# Patient Record
Sex: Female | Born: 1980 | Race: White | Hispanic: No | Marital: Single | Smoking: Never smoker
Health system: Southern US, Community
[De-identification: ages and names within clinical notes are randomized; demographics above are authoritative.]

## PROBLEM LIST (undated history)

## (undated) HISTORY — PX: HERNIA REPAIR: SHX51

---

## 2004-08-13 ENCOUNTER — Emergency Department: Payer: Self-pay | Admitting: Emergency Medicine

## 2009-10-23 ENCOUNTER — Emergency Department (HOSPITAL_COMMUNITY): Admission: EM | Admit: 2009-10-23 | Discharge: 2009-10-23 | Payer: Self-pay | Admitting: Emergency Medicine

## 2011-03-04 ENCOUNTER — Emergency Department (HOSPITAL_COMMUNITY)
Admission: EM | Admit: 2011-03-04 | Discharge: 2011-03-04 | Disposition: A | Discharge status: Home / Self Care | Payer: Self-pay | Attending: Emergency Medicine | Admitting: Emergency Medicine

## 2011-03-04 ENCOUNTER — Emergency Department (HOSPITAL_COMMUNITY): Payer: Self-pay

## 2011-03-04 ENCOUNTER — Encounter: Payer: Self-pay | Admitting: Emergency Medicine

## 2011-03-04 DIAGNOSIS — S46911A Strain of unspecified muscle, fascia and tendon at shoulder and upper arm level, right arm, initial encounter: Secondary | ICD-10-CM

## 2011-03-04 DIAGNOSIS — M79609 Pain in unspecified limb: Secondary | ICD-10-CM | POA: Insufficient documentation

## 2011-03-04 DIAGNOSIS — M25519 Pain in unspecified shoulder: Secondary | ICD-10-CM | POA: Insufficient documentation

## 2011-03-04 DIAGNOSIS — X500XXA Overexertion from strenuous movement or load, initial encounter: Secondary | ICD-10-CM | POA: Insufficient documentation

## 2011-03-04 DIAGNOSIS — IMO0002 Reserved for concepts with insufficient information to code with codable children: Secondary | ICD-10-CM | POA: Insufficient documentation

## 2011-03-04 DIAGNOSIS — M542 Cervicalgia: Secondary | ICD-10-CM | POA: Insufficient documentation

## 2011-03-04 MED ORDER — OXYCODONE-ACETAMINOPHEN 5-325 MG PO TABS: 1.0000 | ORAL_TABLET | ORAL | Status: AC | PRN

## 2011-03-04 NOTE — ED Notes (Signed)
Pt c/o pain in her right shoulder x 1 week. Denies any injury. Pt states that it hurts to move her shoulder. Alert and oriented x 3. Skin warm and dry. Color pink.

## 2011-03-04 NOTE — ED Notes (Signed)
Pt c/o right shoulder pain, radiating down right arm x 1 week. Pain worse with movement/lifting. 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

## 2011-03-04 NOTE — ED Provider Notes (Signed)
History   This chart was scribed for Joya Gaskins, MD by Charolett Bumpers . The patient was seen in room APFT23/APFT23 and the patient's care was started at 4:20pm.   CSN: [id number]  Arrival date & time 03/04/11  1500   First MD Initiated Contact with Patient 03/04/11 1620      Chief Complaint  Patient presents with  . Shoulder Pain    HPI Rebekah Mejia is a 30 y.o. female who presents to the Emergency Department complaining of constant, moderate to severe right shoulder pain that radiates down her right arm with an onset of 1 week ago. She has associated neck pain with movement. She denies fever, chills, chest pain, SOB, and weakness in extremities. The shoulder pain is made worse with movement and laying on it. Patient also stated she has taken tylenol and applied heat to the area with no relief. She stated she has had similar pain when she had a shoulder sprain a few years ago. Patient also stated she had no major medical problems or surgeries on right shoulder/arm.    History reviewed. No pertinent past medical history.  History reviewed. No pertinent past surgical history.  No family history on file.  History  Substance Use Topics  . Smoking status: Never Smoker   . Smokeless tobacco: Not on file  . Alcohol Use: No    OB History    Grav Para Term Preterm Abortions TAB SAB Ect Mult Living                  Review of Systems A complete 10 system review of systems was obtained and is otherwise negative except as noted in the HPI and PMH.   Allergies  Review of patient's allergies indicates no known allergies.  Home Medications  No current outpatient prescriptions on file.  BP 135/92  Pulse 88  Temp(Src) 98.3 F (36.8 C) (Oral)  Resp 20  SpO2 99%  LMP 02/27/2011  Physical Exam CONSTITUTIONAL: Well developed/well nourished HEAD AND FACE: Normocephalic/atraumatic EYES: EOMI/PERRL ENMT: Mucous membranes moist NECK: supple no meningeal  signs SPINE:entire spine nontender CV: S1/S2 noted, no murmurs/rubs/gallops noted LUNGS: Lungs are clear to auscultation bilaterally, no apparent distress ABDOMEN: soft, nontender, no rebound or guarding NEURO: Pt is awake/alert, moves all extremitiesx4 EXTREMITIES: pulses normal, full ROM in extremities, right anterior shoulder tender, no weakness in extremities. Equal hand grip, equal power with elbow flexion.  No erythema/warmth noted to right shoulder SKIN: warm, color normal PSYCH: no abnormalities of mood noted  ED Course  Procedures   DIAGNOSTIC STUDIES: Oxygen Saturation is 99% on room air, normal by my interpretation.    COORDINATION OF CARE:   Labs Reviewed - No data to display Dg Shoulder Right  03/04/2011  *RADIOLOGY REPORT*  Clinical Data: While lifting at work  RIGHT SHOULDER - 2+ VIEW  Comparison: None.  Findings: No fracture or dislocation.  The glenohumeral and acromioclavicular joint spaces are preserved.  No evidence of calcific tendonitis. The regional soft tissues are normal.  Limited visualization of the adjacent thorax is normal.  IMPRESSION: Normal radiographs of the right shoulder.  Original Report Authenticated By: Waynard Reeds, M.D.       MDM  xrays reviewed and considered Nursing notes reviewed and considered in documentation    I personally performed the services described in this documentation, which was scribed in my presence. The recorded information has been reviewed and considered.         Dorinda Hill  Forestine Chute, MD 03/04/11 1655

## 2011-10-20 ENCOUNTER — Emergency Department (HOSPITAL_COMMUNITY)
Admission: EM | Admit: 2011-10-20 | Discharge: 2011-10-20 | Disposition: A | Discharge status: Home / Self Care | Payer: Self-pay | Attending: Emergency Medicine | Admitting: Emergency Medicine

## 2011-10-20 ENCOUNTER — Encounter (HOSPITAL_COMMUNITY): Payer: Self-pay | Admitting: *Deleted

## 2011-10-20 ENCOUNTER — Emergency Department (HOSPITAL_COMMUNITY): Payer: Self-pay

## 2011-10-20 DIAGNOSIS — X500XXA Overexertion from strenuous movement or load, initial encounter: Secondary | ICD-10-CM | POA: Insufficient documentation

## 2011-10-20 DIAGNOSIS — S46911A Strain of unspecified muscle, fascia and tendon at shoulder and upper arm level, right arm, initial encounter: Secondary | ICD-10-CM

## 2011-10-20 DIAGNOSIS — Y9269 Other specified industrial and construction area as the place of occurrence of the external cause: Secondary | ICD-10-CM | POA: Insufficient documentation

## 2011-10-20 DIAGNOSIS — IMO0002 Reserved for concepts with insufficient information to code with codable children: Secondary | ICD-10-CM | POA: Insufficient documentation

## 2011-10-20 MED ORDER — HYDROCODONE-ACETAMINOPHEN 5-325 MG PO TABS: 12.0000 | ORAL_TABLET | Freq: Four times a day (QID) | ORAL | Status: AC | PRN

## 2011-10-20 NOTE — ED Provider Notes (Signed)
History     CSN: [id number]  Arrival date & time 10/20/11  1734   First MD Initiated Contact with Patient 10/20/11 1922      Chief Complaint  Patient presents with  . 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  She has not notified her employer and has not worked since the injury.  Denies pre-existing shoulder problem.  "i really need something for pain".  Patient is a 31 y.o. female presenting with shoulder pain. The history is provided by the patient. No language interpreter was used.  Shoulder Pain This is a new problem. Episode onset: ~ 1 week ago. The problem occurs constantly. The problem has been unchanged. Pertinent negatives include no numbness or weakness. She has tried NSAIDs for the symptoms. The treatment provided no relief.    History reviewed. No pertinent past medical history.  History reviewed. No pertinent past surgical history.  History reviewed. No pertinent family history.  History  Substance Use Topics  . Smoking status: Never Smoker   . Smokeless tobacco: Not on file  . Alcohol Use: No    OB History    Grav Para Term Preterm Abortions TAB SAB Ect Mult Living                  Review of Systems  Musculoskeletal:       Shoulder injury.  Neurological: Negative for weakness and numbness.  All other systems reviewed and are negative.    Allergies  Percocet  Home Medications   Current Outpatient Rx  Name Route Sig Dispense Refill  . IBUPROFEN 200 MG PO TABS Oral Take 400 mg by mouth every 6 (six) hours as needed. For pain    . HYDROCODONE-ACETAMINOPHEN 5-325 MG PO TABS Oral Take 12 tablets by mouth every 6 (six) hours as needed for pain. 12 tablet 0    BP 150/83  Pulse 75  Temp 98.5 F (36.9 C) (Oral)  Resp 17  Ht 5\' 4"  (1.626 m)  Wt 156 lb 8 oz (70.988 kg)  BMI 26.86 kg/m2   SpO2 100%  LMP 10/11/2011  Physical Exam  Nursing note and vitals reviewed. Constitutional: She is oriented to person, place, and time. She appears well-developed and well-nourished. No distress.  HENT:  Head: Normocephalic and atraumatic.  Eyes: EOM are normal.  Neck: Normal range of motion.  Cardiovascular: Normal rate, regular rhythm and normal heart sounds.   Pulmonary/Chest: Effort normal and breath sounds normal.  Abdominal: Soft. She exhibits no distension. There is no tenderness.  Musculoskeletal: She exhibits tenderness.       Right shoulder: She exhibits decreased range of motion, tenderness, bony tenderness and pain. She exhibits no swelling, no effusion, no crepitus, no deformity, no laceration and normal pulse.       Arms: Neurological: She is alert and oriented to person, place, and time.  Skin: Skin is warm and dry.  Psychiatric: She has a normal mood and affect. Judgment normal.    ED Course  Procedures (including critical care time)  Labs Reviewed - No data to display Dg Shoulder Right  10/20/2011  *RADIOLOGY REPORT*  Clinical Data: Shoulder pain for 1 week.  No injury.  RIGHT SHOULDER - 2+ VIEW  Comparison:  None.  Findings:  There is no evidence of fracture or dislocation.  There is no evidence of arthropathy or other focal bone abnormality. Soft tissues are  unremarkable.  IMPRESSION: Negative.  Original Report Authenticated By: Elsie Stain, M.D.     1. Muscle strain of right shoulder       MDM  rx-hydrocodone, 12 Sling, ice  Ibuprofen F/u with dr. Hilda Lias prn        Evalina Field, PA 10/20/11 [phone]

## 2011-10-20 NOTE — ED Provider Notes (Signed)
Medical screening examination/treatment/procedure(s) were performed by non-physician practitioner and as supervising physician I was immediately available for consultation/collaboration. Devoria Albe, MD, FACEP   Ward Givens, MD 10/20/11 [phone]

## 2011-10-20 NOTE — ED Notes (Signed)
Pt c/o left shoulder pain x1 week. Pt could not recall a specific event in which she injured the shoulder but she thinks she "pulled a muscle".

## 2011-10-20 NOTE — ED Notes (Signed)
Rt shoulder pain , increased pain with movement. No injury

## 2013-12-31 ENCOUNTER — Emergency Department: Payer: Self-pay | Admitting: Emergency Medicine

## 2015-03-10 ENCOUNTER — Encounter (HOSPITAL_COMMUNITY): Payer: Self-pay | Admitting: Emergency Medicine

## 2015-03-10 ENCOUNTER — Emergency Department (HOSPITAL_COMMUNITY): Payer: Self-pay

## 2015-03-10 ENCOUNTER — Emergency Department (HOSPITAL_COMMUNITY)
Admission: EM | Admit: 2015-03-10 | Discharge: 2015-03-10 | Disposition: A | Discharge status: Home / Self Care | Payer: Self-pay | Attending: Emergency Medicine | Admitting: Emergency Medicine

## 2015-03-10 DIAGNOSIS — Y92410 Unspecified street and highway as the place of occurrence of the external cause: Secondary | ICD-10-CM | POA: Insufficient documentation

## 2015-03-10 DIAGNOSIS — Y998 Other external cause status: Secondary | ICD-10-CM | POA: Insufficient documentation

## 2015-03-10 DIAGNOSIS — Y9389 Activity, other specified: Secondary | ICD-10-CM | POA: Insufficient documentation

## 2015-03-10 DIAGNOSIS — S4991XA Unspecified injury of right shoulder and upper arm, initial encounter: Secondary | ICD-10-CM | POA: Insufficient documentation

## 2015-03-10 DIAGNOSIS — S4992XA Unspecified injury of left shoulder and upper arm, initial encounter: Secondary | ICD-10-CM | POA: Insufficient documentation

## 2015-03-10 MED ORDER — CYCLOBENZAPRINE HCL 5 MG PO TABS: 5.0000 mg | ORAL_TABLET | Freq: Three times a day (TID) | ORAL | Status: DC | PRN

## 2015-03-10 MED ORDER — NAPROXEN 500 MG PO TABS: 500.0000 mg | ORAL_TABLET | Freq: Two times a day (BID) | ORAL | Status: DC

## 2015-03-10 MED ORDER — IBUPROFEN 800 MG PO TABS
800.0000 mg | ORAL_TABLET | Freq: Once | ORAL | Status: AC
  Administered 2015-03-10: 800 mg via ORAL
  Filled 2015-03-10: qty 1

## 2015-03-10 MED ORDER — IBUPROFEN 600 MG PO TABS: 600.0000 mg | ORAL_TABLET | Freq: Four times a day (QID) | ORAL | Status: DC | PRN

## 2015-03-10 NOTE — ED Notes (Signed)
Called for triage and no response ?

## 2015-03-10 NOTE — ED Notes (Signed)
Patient stated that she is not happy with prescriptions given to her. I informed her that we were not prescribing narcotics she said she wanted to work something out with the provider. I reiterated that she would not write an Rx for percocet. She wants to talk with provider.
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
## 2016-05-06 ENCOUNTER — Encounter (HOSPITAL_COMMUNITY): Payer: Self-pay | Admitting: Emergency Medicine

## 2016-05-06 ENCOUNTER — Emergency Department (HOSPITAL_COMMUNITY)
Admission: EM | Admit: 2016-05-06 | Discharge: 2016-05-06 | Disposition: A | Discharge status: Home / Self Care | Payer: Self-pay | Attending: Emergency Medicine | Admitting: Emergency Medicine

## 2016-05-06 ENCOUNTER — Emergency Department (HOSPITAL_COMMUNITY): Payer: Self-pay

## 2016-05-06 DIAGNOSIS — R52 Pain, unspecified: Secondary | ICD-10-CM

## 2016-05-06 DIAGNOSIS — M542 Cervicalgia: Secondary | ICD-10-CM | POA: Insufficient documentation

## 2016-05-06 DIAGNOSIS — Z79899 Other long term (current) drug therapy: Secondary | ICD-10-CM | POA: Insufficient documentation

## 2016-05-06 DIAGNOSIS — M546 Pain in thoracic spine: Secondary | ICD-10-CM | POA: Insufficient documentation

## 2016-05-06 MED ORDER — METHOCARBAMOL 500 MG PO TABS: 500.0000 mg | ORAL_TABLET | Freq: Two times a day (BID) | ORAL | 0 refills | Status: DC | PRN

## 2016-05-06 MED ORDER — IBUPROFEN 600 MG PO TABS: 600.0000 mg | ORAL_TABLET | Freq: Four times a day (QID) | ORAL | 0 refills | Status: AC | PRN

## 2016-05-06 NOTE — Discharge Instructions (Signed)
Back Pain: ° ° °Your back pain should be treated with medicines such as ibuprofen or aleve and this back pain should get better over the next 2 weeks.  However if you develop severe or worsening pain, low back pain with fever, numbness, weakness or inability to walk or urinate, you should return to the ER immediately.  Please follow up with your doctor this week for a recheck if still having symptoms. °Low back pain is discomfort in the lower back that may be due to injuries to muscles and ligaments around the spine.  Occasionally, it may be caused by a a problem to a part of the spine called a disc.  The pain may last several days or a week;  However, most patients get completely well in 4 weeks. ° °Self - care:  The application of heat can help soothe the pain.  Maintaining your daily activities, including walking, is encourged, as it will help you get better faster than just staying in bed. ° °Medications are also useful to help with pain control.  A commonly prescribed medications includes acetaminophen.  This medication is generally safe, though you should not take more than 8 of the extra strength (500mg) pills a day. ° °Non steroidal anti inflammatory medications including Ibuprofen and naproxen;  These medications help both pain and swelling and are very useful in treating back pain.  They should be taken with food, as they can cause stomach upset, and more seriously, stomach bleeding.   ° °Muscle relaxants:  These medications can help with muscle tightness that is a cause of lower back pain.  Most of these medications can cause drowsiness, and it is not safe to drive or use dangerous machinery while taking them. ° °You will need to follow up with  Your primary healthcare provider in 1-2 weeks for reassessment. ° °Be aware that if you develop new symptoms, such as a fever, leg weakness, difficulty with or loss of control of your urine or bowels, abdominal pain, or more severe pain, you will need to seek  medical attention and  / or return to the Emergency department. ° °If you do not have a doctor see the list below. ° °[place] [facility] Doctor List ° ° ° °Edward Hawkins MD. Specialty: Pulmonary Disease Contact information: [address]  °[address]  °[place] [address]  °[phone]  ° °Margaret Simpson, MD. Specialty: Family Medicine Contact information: [address]  °[place] [address]  °[phone]  ° °Scott Luking, MD. Specialty: Family Medicine Contact information: [address]  °[address]  °[place] [address]  °[phone]  ° °Tesfaye Fanta, MD Specialty: Internal Medicine Contact information: [address]  °[place] [address]  °[phone]  ° °Zach Hall, MD. Specialty: Internal Medicine Contact information: [address]  °[place] [address]  °[phone]  ° °Angus Mcinnis, MD. Specialty: Family Medicine Contact information: [address]  °[place] [address]  °[phone]  ° °Stephen Knowlton, MD. Specialty: Family Medicine Contact information: [address]  °[address]  °[place] [address]  °[phone]  ° °Roy Fagan, MD. Specialty: Internal Medicine Contact information: [address]  °[address]  ° [place] [address]  °[phone]  ° ° ° °

## 2016-05-06 NOTE — ED Triage Notes (Signed)
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

## 2016-05-06 NOTE — ED Notes (Signed)
Pt returned from X-ray.  
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
## 2016-07-28 ENCOUNTER — Ambulatory Visit
Admission: EM | Admit: 2016-07-28 | Discharge: 2016-07-28 | Disposition: A | Discharge status: Home / Self Care | Payer: Self-pay | Attending: Internal Medicine | Admitting: Internal Medicine

## 2016-07-28 ENCOUNTER — Encounter: Payer: Self-pay | Admitting: *Deleted

## 2016-07-28 DIAGNOSIS — H66002 Acute suppurative otitis media without spontaneous rupture of ear drum, left ear: Secondary | ICD-10-CM

## 2016-07-28 MED ORDER — AMOXICILLIN 875 MG PO TABS: 875.0000 mg | ORAL_TABLET | Freq: Two times a day (BID) | ORAL | 0 refills | Status: AC

## 2016-07-28 NOTE — ED Provider Notes (Signed)
[facility]    CSN: [id number] Arrival date & time: 07/28/16  1317     History   Chief Complaint Chief Complaint  Patient presents with  . Otalgia    HPI Rebekah Mejia is a 36 y.o. female.   Pt c/o of ear pain and decreased hearing x1 week.  She admits to subjective fevers.       History reviewed. No pertinent past medical history.  There are no active problems to display for this patient.   Past Surgical History:  Procedure Laterality Date  . HERNIA REPAIR      OB History    Gravida Para Term Preterm AB Living   0 0 0 0 0 0   SAB TAB Ectopic Multiple Live Births   0 0 0 0 0       Home Medications    Prior to Admission medications   Medication Sig Start Date End Date Taking? Authorizing Provider  amoxicillin (AMOXIL) 875 MG tablet Take 1 tablet (875 mg total) by mouth 2 (two) times daily. 07/28/16 08/07/16  Arnaldo Nataliamond, Traven Davids S, MD  cyclobenzaprine (FLEXERIL) 5 MG tablet Take 1 tablet (5 mg total) by mouth 3 (three) times daily as needed for muscle spasms. 03/10/15   Burgess AmorIdol, Julie, PA-C  ibuprofen (ADVIL,MOTRIN) 600 MG tablet Take 1 tablet (600 mg total) by mouth every 6 (six) hours as needed. 05/06/16   Eber HongMiller, Brian, MD  methocarbamol (ROBAXIN) 500 MG tablet Take 1 tablet (500 mg total) by mouth 2 (two) times daily as needed for muscle spasms. 05/06/16   Eber HongMiller, Brian, MD  naproxen (NAPROSYN) 500 MG tablet Take 1 tablet (500 mg total) by mouth 2 (two) times daily. 03/10/15   Burgess AmorIdol, Julie, PA-C    Family History History reviewed. No pertinent family history.  Social History Social History  Substance Use Topics  . Smoking status: Never Smoker  . Smokeless tobacco: Never Used  . Alcohol use No     Allergies   Patient has no known allergies.   Review of Systems Review of Systems  Constitutional: Negative for chills and fever.  HENT: Positive for ear pain. Negative for sore throat and tinnitus.   Eyes: Negative for redness.  Respiratory:  Negative for cough and shortness of breath.   Cardiovascular: Negative for chest pain and palpitations.  Gastrointestinal: Negative for abdominal pain, diarrhea, nausea and vomiting.  Genitourinary: Negative for dysuria, frequency and urgency.  Musculoskeletal: Negative for myalgias.  Skin: Negative for rash.       No lesions  Neurological: Negative for weakness.  Hematological: Does not bruise/bleed easily.  Psychiatric/Behavioral: Negative for suicidal ideas.     Physical Exam Triage Vital Signs ED Triage Vitals  Enc Vitals Group     BP 07/28/16 1349 117/90     Pulse Rate 07/28/16 1349 94     Resp 07/28/16 1349 16     Temp 07/28/16 1349 98.5 F (36.9 C)     Temp Source 07/28/16 1349 Oral     SpO2 07/28/16 1349 98 %     Weight 07/28/16 1348 171 lb (77.6 kg)     Height 07/28/16 1348 5\' 4"  (1.626 m)     Head Circumference --      Peak Flow --      Pain Score 07/28/16 1349 7     Pain Loc --      Pain Edu? --      Excl. in GC? --    No data found.  Updated Vital Signs BP 117/90 (BP Location: Left Arm)   Pulse 94   Temp 98.5 F (36.9 C) (Oral)   Resp 16   Ht 5\' 4"  (1.626 m)   Wt 171 lb (77.6 kg)   LMP 07/20/2016   SpO2 98%   BMI 29.35 kg/m   Visual Acuity Right Eye Distance:   Left Eye Distance:   Bilateral Distance:    Right Eye Near:   Left Eye Near:    Bilateral Near:     Physical Exam  Constitutional: She is oriented to person, place, and time. She appears well-developed and well-nourished. No distress.  HENT:  Head: Normocephalic and atraumatic.  Left Ear: Tympanic membrane is bulging. A middle ear effusion is present.  Mouth/Throat: Oropharynx is clear and moist.  Eyes: Conjunctivae and EOM are normal. Pupils are equal, round, and reactive to light. No scleral icterus.  Neck: Normal range of motion. Neck supple. No JVD present. No tracheal deviation present. No thyromegaly present.  Cardiovascular: Normal rate, regular rhythm and normal heart  sounds.  Exam reveals no gallop and no friction rub.   No murmur heard. Pulmonary/Chest: Effort normal and breath sounds normal.  Abdominal: Soft. Bowel sounds are normal. She exhibits no distension. There is no tenderness.  Musculoskeletal: Normal range of motion. She exhibits no edema.  Lymphadenopathy:    She has no cervical adenopathy.  Neurological: She is alert and oriented to person, place, and time. No cranial nerve deficit.  Skin: Skin is warm and dry.  Psychiatric: She has a normal mood and affect. Her behavior is normal. Judgment and thought content normal.  Nursing note and vitals reviewed.    UC Treatments / Results  Labs (all labs ordered are listed, but only abnormal results are displayed) Labs Reviewed - No data to display  EKG  EKG Interpretation None       Radiology No results found.  Procedures Procedures (including critical care time)  Medications Ordered in UC Medications - No data to display   Initial Impression / Assessment and Plan / UC Course  I have reviewed the triage vital signs and the nursing notes.  Pertinent labs & imaging results that were available during my care of the patient were reviewed by me and considered in my medical decision making (see chart for details).     Supporative AOM. Rx amox.   Final Clinical Impressions(s) / UC Diagnoses   Final diagnoses:  Acute suppurative otitis media of left ear without spontaneous rupture of tympanic membrane, recurrence not specified    New Prescriptions New Prescriptions   AMOXICILLIN (AMOXIL) 875 MG TABLET    Take 1 tablet (875 mg total) by mouth 2 (two) times daily.     Arnaldo Natal, MD 07/28/16 [phone]

## 2016-07-28 NOTE — ED Triage Notes (Signed)
Patient started having hearing problems and pain in left ear 1 week ago.  Patient started a job recently that requires her to wear ear plugs.

## 2016-08-08 ENCOUNTER — Ambulatory Visit
Admission: EM | Admit: 2016-08-08 | Discharge: 2016-08-08 | Disposition: A | Discharge status: Home / Self Care | Payer: Self-pay | Attending: Family Medicine | Admitting: Family Medicine

## 2016-08-08 ENCOUNTER — Encounter: Payer: Self-pay | Admitting: Emergency Medicine

## 2016-08-08 DIAGNOSIS — H6982 Other specified disorders of Eustachian tube, left ear: Secondary | ICD-10-CM

## 2016-08-08 DIAGNOSIS — H6502 Acute serous otitis media, left ear: Secondary | ICD-10-CM

## 2016-08-08 DIAGNOSIS — H6123 Impacted cerumen, bilateral: Secondary | ICD-10-CM

## 2016-08-08 MED ORDER — PREDNISONE 10 MG (21) PO TBPK: ORAL_TABLET | ORAL | 0 refills | Status: DC

## 2016-08-08 MED ORDER — CEFUROXIME AXETIL 500 MG PO TABS: 500.0000 mg | ORAL_TABLET | Freq: Two times a day (BID) | ORAL | 0 refills | Status: DC

## 2016-08-08 NOTE — ED Provider Notes (Signed)
[facility]    CSN: [id number] Arrival date & time: 08/08/16  1312     History   Chief Complaint Chief Complaint  Patient presents with  . Otalgia    HPI Rebekah Mejia is a 36 y.o. female.   Patient is here because of left ear pain. She states that she was treated by one of the physicians here about 2-3 weeks ago for left serous otitis. She states she was placed on amoxicillin things got better for a while but then returned as the pain and discomfort. She states that the discomfort in pressures on the left side of her ear. She denies any fever but she's had dizziness and lightheadedness for the last 2 days as well. She does not smoke. No chronic medical problems. She's had a hernia repair. No known drug allergies no pertinent family medical history relevant to today's visit.   The history is provided by the patient. No language interpreter was used.  Otalgia  Location:  Left Behind ear:  No abnormality Quality:  Aching, dull and shooting Severity:  Moderate Onset quality:  Sudden Timing:  Constant Progression:  Worsening Chronicity:  New Context: recent URI   Relieved by:  Nothing Worsened by:  Nothing Ineffective treatments:  None tried Associated symptoms: rhinorrhea   Risk factors: no recent travel and no prior ear surgery     History reviewed. No pertinent past medical history.  There are no active problems to display for this patient.   Past Surgical History:  Procedure Laterality Date  . HERNIA REPAIR      OB History    Gravida Para Term Preterm AB Living   0 0 0 0 0 0   SAB TAB Ectopic Multiple Live Births   0 0 0 0 0       Home Medications    Prior to Admission medications   Medication Sig Start Date End Date Taking? Authorizing Provider  cefUROXime (CEFTIN) 500 MG tablet Take 1 tablet (500 mg total) by mouth 2 (two) times daily. 08/08/16   Hassan RowanWade, Geraldene Eisel, MD  ibuprofen (ADVIL,MOTRIN) 600 MG tablet Take 1 tablet (600 mg total) by  mouth every 6 (six) hours as needed. 05/06/16   Eber HongMiller, Brian, MD  predniSONE (STERAPRED UNI-PAK 21 TAB) 10 MG (21) TBPK tablet Sig 6 tablet day 1, 5 tablets day 2, 4 tablets day 3,,3tablets day 4, 2 tablets day 5, 1 tablet day 6 take all tablets orally 08/08/16   Hassan RowanWade, Angely Dietz, MD    Family History History reviewed. No pertinent family history.  Social History Social History  Substance Use Topics  . Smoking status: Never Smoker  . Smokeless tobacco: Never Used  . Alcohol use No     Allergies   Patient has no known allergies.   Review of Systems Review of Systems  HENT: Positive for ear pain and rhinorrhea.   Neurological: Positive for dizziness.  All other systems reviewed and are negative.    Physical Exam Triage Vital Signs ED Triage Vitals  Enc Vitals Group     BP 08/08/16 1422 128/90     Pulse Rate 08/08/16 1422 83     Resp 08/08/16 1422 16     Temp 08/08/16 1422 98.5 F (36.9 C)     Temp Source 08/08/16 1422 Oral     SpO2 08/08/16 1422 100 %     Weight 08/08/16 1422 171 lb (77.6 kg)     Height 08/08/16 1422 5\' 4"  (1.626 m)  Head Circumference --      Peak Flow --      Pain Score 08/08/16 1423 8     Pain Loc --      Pain Edu? --      Excl. in GC? --    No data found.   Updated Vital Signs BP 128/90 (BP Location: Left Arm)   Pulse 83   Temp 98.5 F (36.9 C) (Oral)   Resp 16   Ht 5\' 4"  (1.626 m)   Wt 168 lb 12.8 oz (76.6 kg)   LMP 07/20/2016   SpO2 100%   BMI 28.97 kg/m   Visual Acuity Right Eye Distance:   Left Eye Distance:   Bilateral Distance:    Right Eye Near:   Left Eye Near:    Bilateral Near:     Physical Exam  Constitutional: She is oriented to person, place, and time. She appears well-developed and well-nourished. No distress.  HENT:  Head: Normocephalic and atraumatic.  Right Ear: Hearing, tympanic membrane and external ear normal. A foreign body is present.  Left Ear: Hearing and external ear normal. A foreign body is  present. Tympanic membrane is bulging.  Nose: Mucosal edema present. Right sinus exhibits maxillary sinus tenderness. Left sinus exhibits maxillary sinus tenderness.  Mouth/Throat: Uvula is midline and oropharynx is clear and moist. No uvula swelling. No posterior oropharyngeal erythema.  Eyes: Pupils are equal, round, and reactive to light. Right eye exhibits no discharge. Left eye exhibits no discharge.  Neck: Normal range of motion. Neck supple.  Pulmonary/Chest: Effort normal.  Musculoskeletal: Normal range of motion.  Neurological: She is alert and oriented to person, place, and time.  Skin: Skin is warm. She is not diaphoretic.  Psychiatric: She has a normal mood and affect.  Vitals reviewed.    UC Treatments / Results  Labs (all labs ordered are listed, but only abnormal results are displayed) Labs Reviewed - No data to display  EKG  EKG Interpretation None       Radiology No results found.  Procedures Procedures (including critical care time)  Medications Ordered in UC Medications - No data to display   Initial Impression / Assessment and Plan / UC Course  I have reviewed the triage vital signs and the nursing notes.  Pertinent labs & imaging results that were available during my care of the patient were reviewed by me and considered in my medical decision making (see chart for details).     Think patient has eustachian tube dysfunction may have serous otitis to both may be remnants from the otitis media that has not completely resolved. We'll place on Ceftin 500 mg 1 tablet twice a day. Continue using her Allegra-D or Claritin-D daily Toprol place on 6 day course of prednisone. Also because of wax present in both ear canals going to irrigate both ears to make sure the wax is not causing the problem but she does have tenderness behind the left earlobe making me more comfortable that this is a eustachian tube dysfunction problem.  Final Clinical Impressions(s) / UC  Diagnoses   Final diagnoses:  Eustachian tube dysfunction, left  Acute serous otitis media of left ear, recurrence not specified    New Prescriptions New Prescriptions   CEFUROXIME (CEFTIN) 500 MG TABLET    Take 1 tablet (500 mg total) by mouth 2 (two) times daily.   PREDNISONE (STERAPRED UNI-PAK 21 TAB) 10 MG (21) TBPK TABLET    Sig 6 tablet day 1, 5 tablets  day 2, 4 tablets day 3,,3tablets day 4, 2 tablets day 5, 1 tablet day 6 take all tablets orally    Note: This dictation was prepared with Dragon dictation along with smaller phrase technology. Any transcriptional errors that result from this process are unintentional.   Hassan Rowan, MD 08/08/16 1528

## 2016-08-08 NOTE — ED Triage Notes (Signed)
Patient c/o pain in her left ear for 2 weeks.  Patient recently treated for ear infection. Patient reports some nausea and vomiting yesterday.

## 2016-08-17 ENCOUNTER — Ambulatory Visit
Admission: EM | Admit: 2016-08-17 | Discharge: 2016-08-17 | Disposition: A | Discharge status: Home / Self Care | Payer: Self-pay | Attending: Family Medicine | Admitting: Family Medicine

## 2016-08-17 ENCOUNTER — Encounter: Payer: Self-pay | Admitting: Emergency Medicine

## 2016-08-17 DIAGNOSIS — R112 Nausea with vomiting, unspecified: Secondary | ICD-10-CM

## 2016-08-17 DIAGNOSIS — A084 Viral intestinal infection, unspecified: Secondary | ICD-10-CM

## 2016-08-17 LAB — BASIC METABOLIC PANEL
Anion gap: 11 (ref 5–15)
BUN: 11 mg/dL (ref 6–20)
CHLORIDE: 98 mmol/L — AB (ref 101–111)
CO2: 25 mmol/L (ref 22–32)
Calcium: 8.9 mg/dL (ref 8.9–10.3)
Creatinine, Ser: 0.94 mg/dL (ref 0.44–1.00)
GFR calc non Af Amer: >60 mL/min (ref >60)
Glucose, Bld: 120 mg/dL — ABNORMAL HIGH (ref 65–99)
POTASSIUM: 3.1 mmol/L — AB (ref 3.5–5.1)
Sodium: 134 mmol/L — ABNORMAL LOW (ref 135–145)

## 2016-08-17 MED ORDER — POTASSIUM CHLORIDE CRYS ER 10 MEQ PO TBCR
10.0000 meq | EXTENDED_RELEASE_TABLET | Freq: Once | ORAL | Status: AC
  Administered 2016-08-17: 10 meq via ORAL

## 2016-08-17 MED ORDER — ONDANSETRON 8 MG PO TBDP
8.0000 mg | ORAL_TABLET | Freq: Once | ORAL | Status: AC
  Administered 2016-08-17: 8 mg via ORAL

## 2016-08-17 MED ORDER — ONDANSETRON 8 MG PO TBDP: 8.0000 mg | ORAL_TABLET | Freq: Three times a day (TID) | ORAL | 0 refills | Status: DC | PRN

## 2016-08-17 NOTE — ED Provider Notes (Signed)
[facility]    CSN: [id number] Arrival date & time: 08/17/16  1557     History   Chief Complaint Chief Complaint  Patient presents with  . Diarrhea    HPI Rebekah Mejia is a 36 y.o. female.   36 yo female with a c/o nausea and vomiting since this morning, associated with loose stool as well. Denies any fevers, chills, abdominal pain.    The history is provided by the patient.    History reviewed. No pertinent past medical history.  There are no active problems to display for this patient.   Past Surgical History:  Procedure Laterality Date  . HERNIA REPAIR      OB History    Gravida Para Term Preterm AB Living   0 0 0 0 0 0   SAB TAB Ectopic Multiple Live Births   0 0 0 0 0       Home Medications    Prior to Admission medications   Medication Sig Start Date End Date Taking? Authorizing Provider  cefUROXime (CEFTIN) 500 MG tablet Take 1 tablet (500 mg total) by mouth 2 (two) times daily. 08/08/16   Hassan Rowan, MD  ibuprofen (ADVIL,MOTRIN) 600 MG tablet Take 1 tablet (600 mg total) by mouth every 6 (six) hours as needed. 05/06/16   Eber Hong, MD  ondansetron (ZOFRAN ODT) 8 MG disintegrating tablet Take 1 tablet (8 mg total) by mouth every 8 (eight) hours as needed. 08/17/16   Payton Mccallum, MD  predniSONE (STERAPRED UNI-PAK 21 TAB) 10 MG (21) TBPK tablet Sig 6 tablet day 1, 5 tablets day 2, 4 tablets day 3,,3tablets day 4, 2 tablets day 5, 1 tablet day 6 take all tablets orally 08/08/16   Hassan Rowan, MD    Family History History reviewed. No pertinent family history.  Social History Social History  Substance Use Topics  . Smoking status: Never Smoker  . Smokeless tobacco: Never Used  . Alcohol use No     Allergies   Patient has no known allergies.   Review of Systems Review of Systems   Physical Exam Triage Vital Signs ED Triage Vitals  Enc Vitals Group     BP 08/17/16 1609 (!) 145/99     Pulse Rate 08/17/16 1609 88   Resp 08/17/16 1609 16     Temp 08/17/16 1609 99.5 F (37.5 C)     Temp Source 08/17/16 1609 Oral     SpO2 08/17/16 1609 100 %     Weight 08/17/16 1609 169 lb (76.7 kg)     Height 08/17/16 1609 5\' 4"  (1.626 m)     Head Circumference --      Peak Flow --      Pain Score 08/17/16 1610 8     Pain Loc --      Pain Edu? --      Excl. in GC? --    No data found.   Updated Vital Signs BP (!) 145/99 (BP Location: Left Arm)   Pulse 88   Temp 99.5 F (37.5 C) (Oral)   Resp 16   Ht 5\' 4"  (1.626 m)   Wt 169 lb (76.7 kg)   LMP 07/20/2016   SpO2 100%   BMI 29.01 kg/m   Visual Acuity Right Eye Distance:   Left Eye Distance:   Bilateral Distance:    Right Eye Near:   Left Eye Near:    Bilateral Near:     Physical Exam  Constitutional: She appears  well-developed and well-nourished. No distress.  Abdominal: Soft. Bowel sounds are normal. She exhibits no distension and no mass. There is no tenderness. There is no rebound and no guarding.  Skin: She is not diaphoretic.  Nursing note and vitals reviewed.    UC Treatments / Results  Labs (all labs ordered are listed, but only abnormal results are displayed) Labs Reviewed  BASIC METABOLIC PANEL - Abnormal; Notable for the following:       Result Value   Sodium 134 (*)    Potassium 3.1 (*)    Chloride 98 (*)    Glucose, Bld 120 (*)    All other components within normal limits    EKG  EKG Interpretation None       Radiology No results found.  Procedures Procedures (including critical care time)  Medications Ordered in UC Medications  ondansetron (ZOFRAN-ODT) disintegrating tablet 8 mg (8 mg Oral Given 08/17/16 1700)  potassium chloride (K-DUR,KLOR-CON) CR tablet 10 mEq (10 mEq Oral Given 08/17/16 1727)     Initial Impression / Assessment and Plan / UC Course  I have reviewed the triage vital signs and the nursing notes.  Pertinent labs & imaging results that were available during my care of the patient were  reviewed by me and considered in my medical decision making (see chart for details).       Final Clinical Impressions(s) / UC Diagnoses   Final diagnoses:  Viral gastroenteritis    New Prescriptions Discharge Medication List as of 08/17/2016  5:27 PM    START taking these medications   Details  ondansetron (ZOFRAN ODT) 8 MG disintegrating tablet Take 1 tablet (8 mg total) by mouth every 8 (eight) hours as needed., Starting Wed 08/17/2016, Normal       1. Lab results and diagnosis reviewed with patient 2. rx as per orders above; reviewed possible side effects, interactions, risks and benefits  3. Recommend supportive treatment with increased fluids/increased fluids, otc imodium ad prn 4. Follow-up prn if symptoms worsen or don't improve   Payton Mccallumonty, Devontre Siedschlag, MD 08/19/16 1113

## 2016-08-17 NOTE — Discharge Instructions (Signed)
Imodium AD (over the counter) as needed for diarrhea

## 2016-08-17 NOTE — ED Triage Notes (Signed)
Patient c/o nausea and vomiting that started this morning.

## 2016-12-19 ENCOUNTER — Encounter (HOSPITAL_COMMUNITY): Payer: Self-pay

## 2016-12-19 ENCOUNTER — Emergency Department (HOSPITAL_COMMUNITY)
Admission: EM | Admit: 2016-12-19 | Discharge: 2016-12-19 | Disposition: A | Discharge status: Home / Self Care | Payer: Self-pay | Attending: Emergency Medicine | Admitting: Emergency Medicine

## 2016-12-19 ENCOUNTER — Emergency Department (HOSPITAL_COMMUNITY): Payer: Self-pay

## 2016-12-19 DIAGNOSIS — Z79899 Other long term (current) drug therapy: Secondary | ICD-10-CM | POA: Insufficient documentation

## 2016-12-19 DIAGNOSIS — M546 Pain in thoracic spine: Secondary | ICD-10-CM

## 2016-12-19 DIAGNOSIS — M545 Low back pain, unspecified: Secondary | ICD-10-CM

## 2016-12-19 DIAGNOSIS — W19XXXA Unspecified fall, initial encounter: Secondary | ICD-10-CM

## 2016-12-19 LAB — POC URINE PREG, ED: PREG TEST UR: NEGATIVE

## 2016-12-19 MED ORDER — TRAMADOL HCL 50 MG PO TABS: 50.0000 mg | ORAL_TABLET | Freq: Four times a day (QID) | ORAL | 0 refills | Status: DC | PRN

## 2016-12-19 MED ORDER — PREDNISONE 10 MG PO TABS: ORAL_TABLET | ORAL | 0 refills | Status: DC

## 2016-12-19 MED ORDER — CYCLOBENZAPRINE HCL 5 MG PO TABS: 5.0000 mg | ORAL_TABLET | Freq: Three times a day (TID) | ORAL | 0 refills | Status: DC | PRN

## 2016-12-19 NOTE — ED Triage Notes (Signed)
Patient reports of slipping and falling while at work Thursday and landed on back. Denies neck or head pain. States she has taken multiple otc mediations and needs something stronger for pain.

## 2016-12-19 NOTE — ED Provider Notes (Signed)
[facility] EMERGENCY DEPARTMENT Provider Note   CSN: [id number] Arrival date & time: 12/19/16  1329     History   Chief Complaint Chief Complaint  Patient presents with  . Fall  . Back Pain    HPI Rebekah Mejia is a 36 y.o. female presenting with low back pain since slipping and falling directly on her back 4 days ago.  She has tried otc ibuprofen, motrin, tylenol and ice without relief. She denies hitting her head during the fall and has had no urinary incontinence or retention and no numbness or weakness in her extremities since the fall. Pain is worsened with movement.  She has continued to work but with discomfort.  The history is provided by the patient.    History reviewed. No pertinent past medical history.  There are no active problems to display for this patient.   Past Surgical History:  Procedure Laterality Date  . HERNIA REPAIR      OB History    Gravida Para Term Preterm AB Living   0 0 0 0 0 0   SAB TAB Ectopic Multiple Live Births   0 0 0 0 0       Home Medications    Prior to Admission medications   Medication Sig Start Date End Date Taking? Authorizing Provider  cefUROXime (CEFTIN) 500 MG tablet Take 1 tablet (500 mg total) by mouth 2 (two) times daily. 08/08/16   Hassan Rowan, MD  cyclobenzaprine (FLEXERIL) 5 MG tablet Take 1 tablet (5 mg total) by mouth 3 (three) times daily as needed for muscle spasms. 12/19/16   Burgess Amor, PA-C  ibuprofen (ADVIL,MOTRIN) 600 MG tablet Take 1 tablet (600 mg total) by mouth every 6 (six) hours as needed. 05/06/16   Eber Hong, MD  ondansetron (ZOFRAN ODT) 8 MG disintegrating tablet Take 1 tablet (8 mg total) by mouth every 8 (eight) hours as needed. 08/17/16   Payton Mccallum, MD  predniSONE (DELTASONE) 10 MG tablet Take 6 tablets day one, 5 tablets day two, 4 tablets day three, 3 tablets day four, 2 tablets day five, then 1 tablet day six 12/19/16   Babak Lucus, Raynelle Fanning, PA-C  traMADol (ULTRAM) 50 MG tablet Take 1  tablet (50 mg total) by mouth every 6 (six) hours as needed. 12/19/16   Burgess Amor, PA-C    Family History No family history on file.  Social History Social History  Substance Use Topics  . Smoking status: Never Smoker  . Smokeless tobacco: Never Used  . Alcohol use No     Allergies   Patient has no known allergies.   Review of Systems Review of Systems  Constitutional: Negative for fever.  Respiratory: Negative for shortness of breath.   Cardiovascular: Negative for chest pain and leg swelling.  Gastrointestinal: Negative for abdominal distention, abdominal pain and constipation.  Genitourinary: Negative for difficulty urinating, dysuria, flank pain, frequency and urgency.  Musculoskeletal: Positive for back pain. Negative for gait problem and joint swelling.  Skin: Negative for rash.  Neurological: Negative for weakness and numbness.     Physical Exam Updated Vital Signs BP (!) 142/92 (BP Location: Right Arm)   Pulse 76   Temp 98.3 F (36.8 C) (Oral)   Resp 18   Ht  (1.626 m)   Wt 76.7 kg (169 lb)   LMP 12/12/2016   SpO2 100%   BMI 29.01 kg/m   Physical Exam  Constitutional: She appears well-developed and well-nourished.  HENT:  Head: Normocephalic.  Eyes: Conjunctivae are normal.  Neck: Normal range of motion. Neck supple.  Cardiovascular: Normal rate and intact distal pulses.   Pedal pulses normal.  Pulmonary/Chest: Effort normal.  Abdominal: Soft. Bowel sounds are normal. She exhibits no distension and no mass.  Musculoskeletal: Normal range of motion. She exhibits no edema.       Lumbar back: She exhibits tenderness. She exhibits no swelling, no edema and no spasm.  Neurological: She is alert. She has normal strength. She displays no atrophy and no tremor. No sensory deficit. Gait normal.  Reflex Scores:      Patellar reflexes are 2+ on the right side and 2+ on the left side.      Achilles reflexes are 2+ on the right side and 2+ on the left  side. No strength deficit noted in hip and knee flexor and extensor muscle groups.  Ankle flexion and extension intact.  Skin: Skin is warm and dry.  Psychiatric: She has a normal mood and affect.  Nursing note and vitals reviewed.    ED Treatments / Results  Labs (all labs ordered are listed, but only abnormal results are displayed) Labs Reviewed  POC URINE PREG, ED    EKG  EKG Interpretation None       Radiology Dg Thoracic Spine 2 View  Result Date: 12/19/2016 CLINICAL DATA:  36 y/o  F; status post fall with pain. EXAM: THORACIC SPINE 2 VIEWS COMPARISON:  None. FINDINGS: There is no evidence of thoracic spine fracture. Alignment is normal. No other significant bone abnormalities are identified. IMPRESSION: Negative. Electronically Signed   By: Mitzi Hansen M.D.   On: 12/19/2016 17:56   Dg Lumbar Spine Complete  Result Date: 12/19/2016 CLINICAL DATA:  36 y/o  F; status post fall with pain. EXAM: LUMBAR SPINE - COMPLETE 4+ VIEW COMPARISON:  None. FINDINGS: Five lumbar type non-rib-bearing vertebral bodies. No acute fracture or dislocation. Normal lumbar lordosis without listhesis. Vertebral body and disc space heights are preserved. IMPRESSION: Negative. Electronically Signed   By: Mitzi Hansen M.D.   On: 12/19/2016 17:55    Procedures Procedures (including critical care time)  Medications Ordered in ED Medications - No data to display   Initial Impression / Assessment and Plan / ED Course  I have reviewed the triage vital signs and the nursing notes.  Pertinent labs & imaging results that were available during my care of the patient were reviewed by me and considered in my medical decision making (see chart for details).     No neuro deficit on exam or by history to suggest emergent or surgical presentation.  Also discussed worsened sx that should prompt immediate re-evaluation including distal weakness, bowel/bladder  retention/incontinence.        Final Clinical Impressions(s) / ED Diagnoses   Final diagnoses:  Fall, initial encounter  Acute bilateral thoracic back pain  Lumbar back pain    New Prescriptions New Prescriptions   CYCLOBENZAPRINE (FLEXERIL) 5 MG TABLET    Take 1 tablet (5 mg total) by mouth 3 (three) times daily as needed for muscle spasms.   PREDNISONE (DELTASONE) 10 MG TABLET    Take 6 tablets day one, 5 tablets day two, 4 tablets day three, 3 tablets day four, 2 tablets day five, then 1 tablet day six   TRAMADOL (ULTRAM) 50 MG TABLET    Take 1 tablet (50 mg total) by mouth every 6 (six) hours as needed.     Burgess Amor, PA-C 12/19/16 1610  Loren Racer, MD 12/20/16 [place]

## 2016-12-19 NOTE — Discharge Instructions (Signed)
Your xrays are negative today.  Take the medicines as directed.  Do not drive within 4 hours of taking tramadol as this will make you drowsy.  Avoid lifting,  Bending,  Twisting or any other activity that worsens your pain over the next week.  Apply an  icepack  to your  back for 10-15 minutes every 2 hours for the next 2 days.  You should get rechecked if your symptoms are not better over the next 5 days,  Or you develop increased pain,  Weakness in your leg(s) or loss of bladder or bowel function - these are symptoms of a worse injury.  You may need to followup with your employers choice of physicians (workmans compensation doctor) if you continue to have problems since this was a work related injury.

## 2017-06-02 ENCOUNTER — Other Ambulatory Visit: Payer: Self-pay

## 2017-06-02 ENCOUNTER — Encounter: Payer: Self-pay | Admitting: Emergency Medicine

## 2017-06-02 ENCOUNTER — Ambulatory Visit
Admission: EM | Admit: 2017-06-02 | Discharge: 2017-06-02 | Disposition: A | Discharge status: Home / Self Care | Payer: Self-pay | Attending: Emergency Medicine | Admitting: Emergency Medicine

## 2017-06-02 DIAGNOSIS — K047 Periapical abscess without sinus: Secondary | ICD-10-CM

## 2017-06-02 DIAGNOSIS — K0889 Other specified disorders of teeth and supporting structures: Secondary | ICD-10-CM

## 2017-06-02 MED ORDER — PENICILLIN V POTASSIUM 500 MG PO TABS: 500.0000 mg | ORAL_TABLET | Freq: Four times a day (QID) | ORAL | 0 refills | Status: AC

## 2017-06-02 MED ORDER — OXYCODONE-ACETAMINOPHEN 5-325 MG PO TABS: ORAL_TABLET | ORAL | 0 refills | Status: DC

## 2017-06-02 MED ORDER — CEFTRIAXONE SODIUM 1 G IJ SOLR
1.0000 g | Freq: Once | INTRAMUSCULAR | Status: AC
  Administered 2017-06-02: 1 g via INTRAMUSCULAR

## 2017-06-02 NOTE — Discharge Instructions (Addendum)
Take medication as prescribed. Rest. Drink plenty of fluids. Eat soft food.   Follow up with Dentist as soon as possible.   Follow up with your primary care physician this week as needed. Return to Urgent care or Emergency room for new or worsening concerns.      OPTIONS FOR DENTAL FOLLOW UP CARE  [place] Department of Health and Human Services - Local Safety Net Dental Clinics TripDoors.comhttp://www.ncdhhs.gov/dph/oralhealth/services/safetynetclinics.htm   [facility] [phone])  [facility] [phone])  [facility] [phone]  [facility] [phone])  [facility] [phone]) This clinic caters to the indigent population and is on a lottery system. Location: [employer] [address] [facility], [address], [place] [facility] Hours: Wednesdays from 6pm - 9pm, patients seen by a lottery system. For dates, call or go to [email or web address] Services: Cleanings, fillings and simple extractions. Payment Options: DENTAL WORK IS FREE OF CHARGE. Bring proof of income or support. Best way to get seen: Arrive at 5:15 pm - this is a lottery, NOT first come/first serve, so arriving earlier will not increase your chances of being seen.     [facility] [phone] Select option 1 for emergencies   Location: [facility], [facility], [address], [place] [facility] Hours: No walk-ins accepted - call the day before to schedule an appointment. Check in times are 9:30 am and 1:30 pm. Services: Simple extractions, temporary fillings, pulpectomy/pulp debridement, uncomplicated abscess drainage. Payment Options: PAYMENT IS DUE AT THE TIME OF SERVICE.  Fee is usually $100-200, additional surgical procedures (e.g. abscess drainage) may be extra. Cash, checks, Visa/MasterCard accepted.  Can file Medicaid if patient is covered for dental - patient  should call case worker to check. No discount for [facility] patients. Best way to get seen: MUST call the day before and get onto the schedule. Can usually be seen the next 1-2 days. No walk-ins accepted.     [facility] [phone]   Location: [facility], [address], [place] [facility] Hours: M, W, Th, F 8am or 1:30pm, Tues 9a or 1:30 - first come/first served. Services: Simple extractions, temporary fillings, uncomplicated abscess drainage.  You do not need to be an [place] resident. Payment Options: PAYMENT IS DUE AT THE TIME OF SERVICE. Dental insurance, otherwise sliding scale - bring proof of income or support. Depending on income and treatment needed, cost is usually $50-200. Best way to get seen: Arrive early as it is first come/first served.     [facility] [phone]   Location: [address] Clinic Hours: Mon-Thu 8a-5p Services: Most basic dental services including extractions and fillings. Payment Options: PAYMENT IS DUE AT THE TIME OF SERVICE. Sliding scale, up to 50% off - bring proof if income or support. Medicaid with dental option accepted. Best way to get seen: Call to schedule an appointment, can usually be seen within 2 weeks OR they will try to see walk-ins - show up at 8a or 2p (you may have to wait).     [facility] [phone] [place] RESIDENTS ONLY   Location: [facility], [address], [place], [place] [address] Clinic Hours: By appointment only. Monday - Thursday 8am-5pm, Friday 8am-12pm Services: Cleanings, fillings, extractions. Payment Options: PAYMENT IS DUE AT THE TIME OF SERVICE. Cash, Visa or MasterCard. Sliding scale - $30 minimum per service. Best way to get seen:  Come in to office, complete packet and make an appointment - need proof of income or support monies for each household member  and proof of [facility] residence. Usually takes about a month to get in.     [facility] [phone]   Location: [address] [place] Clinic Hours: Walk-in Urgent Care Dental Services are offered Monday-Friday mornings only. The numbers of emergencies accepted daily is limited to the number of providers available. Maximum 15 - Mondays, Wednesdays & Thursdays Maximum 10 - Tuesdays & Fridays Services: You do not need to be a [facility] resident to be seen for a dental emergency. Emergencies are defined as pain, swelling, abnormal bleeding, or dental trauma. Walkins will receive x-rays if needed. NOTE: Dental cleaning is not an emergency. Payment Options: PAYMENT IS DUE AT THE TIME OF SERVICE. Minimum co-pay is $40.00 for uninsured patients. Minimum co-pay is $3.00 for Medicaid with dental coverage. Dental Insurance is accepted and must be presented at time of visit. Medicare does not cover dental. Forms of payment: Cash, credit card, checks. Best way to get seen: If not previously registered with the clinic, walk-in dental registration begins at 7:15 am and is on a first come/first serve basis. If previously registered with the clinic, call to make an appointment.     [facility] [place] ONLY   Location: [address] [place], [place] Clinic Hours: Mon-Thu 10a-2p Services: Extractions only! Payment Options: FREE (donations accepted) - bring proof of income or support Best way to get seen: Call and schedule an appointment OR come at 8am on the 1st Monday of every month (except for holidays) when it is first come/first served.     [facility] [phone]   Location: [place], [place] Clinic Hours: Friday mornings Services, Payment Options, Best way to get seen: Call for info

## 2017-06-02 NOTE — ED Triage Notes (Signed)
Patient c/o dental pain on the left side of her lower jaw that started a week ago.

## 2017-06-02 NOTE — ED Provider Notes (Signed)
[facility] ____________________________________________  Time seen: Approximately 5:43 PM  I have reviewed the triage vital signs and the nursing notes.   HISTORY  Chief Complaint Dental Pain   HPI Rebekah Mejia is a 37 y.o. female presenting for evaluation of left lower dental pain present since yesterday.  States pain intermittently woke her up throughout the night last night and not being relieved by over-the-counter Tylenol and Advil.  Continues to eat and drink well, but states that it does hurt to eat and drink as she has to chew on the unaffected side.  States today also noticed some swelling to the outside of her left lower mouth.  Denies fevers, recent sickness or recent antibiotic use.  States that she knows she has a cavity to the area but does not currently have dental insurance and will work on getting a with the dentist.  Denies other complaints.  Reports otherwise feels well.  Patient's last menstrual period was 05/26/2017 (approximate).:   Denies pregnancy.  History reviewed. No pertinent past medical history.  There are no active problems to display for this patient.   Past Surgical History:  Procedure Laterality Date  . HERNIA REPAIR      Current Outpatient Rx  . Order #: [id number] Class: Print  . Order #: [id number] Class: Normal  . Order #: [id number] Class: Normal    Allergies Patient has no known allergies.  History reviewed. No pertinent family history.  Social History Social History   Tobacco Use  . Smoking status: Never Smoker  . Smokeless tobacco: Never Used  Substance Use Topics  . Alcohol use: No  . Drug use: No    Review of Systems Constitutional: No fever/chills. Reports continues to eat and drink foods and fluids well.  Eyes: No visual changes. ENT: No sore throat. As above.  Cardiovascular: Denies chest pain. Respiratory: Denies shortness of breath. Gastrointestinal: No abdominal pain.  No nausea, no vomiting.  Skin:  Negative for rash.   ____________________________________________   PHYSICAL EXAM:  VITAL SIGNS: ED Triage Vitals  Enc Vitals Group     BP 06/02/17 1601 123/90     Pulse Rate 06/02/17 1601 82     Resp 06/02/17 1601 16     Temp 06/02/17 1601 98.4 F (36.9 C)     Temp Source 06/02/17 1601 Oral     SpO2 06/02/17 1601 100 %     Weight 06/02/17 1558 169 lb (76.7 kg)     Height 06/02/17 1558 5\' 4"  (1.626 m)     Head Circumference --      Peak Flow --      Pain Score 06/02/17 1558 10     Pain Loc --      Pain Edu? --      Excl. in GC? --     Constitutional: Alert and oriented. Well appearing and in no acute distress. Eyes: Conjunctivae are normal.  Head: Atraumatic.  Mild left lower jaw localized swelling, no induration, no erythema. Ears: Bilateral ears no erythema, normal TMs.  Nose: No congestion/rhinnorhea. Mouth/Throat: Mucous membranes are moist.  Oropharynx non-erythematous. Periodontal Exam   Large dental carry noted to left lower tooth #18 with localized moderate tenderness to tooth and surrounding gum tissue, external gum tissue mildly indurated with swelling, no fluctuance or visualized abscess, no other gumline tenderness noted. Neck: No stridor.  Hematological/Lymphatic/Immunilogical: No cervical lymphadenopathy. Cardiovascular:   Normal rate, regular rhythm. Grossly normal heart sounds. Good peripheral circulation. Respiratory: Normal respiratory effort.  No retractions. Musculoskeletal:  No midline cervical, thoracic or lumbar tenderness to palpation.  Neurologic:  Normal speech and language. Speech is normal. No gait instability. Skin:  Skin is warm, dry and intact. No rash noted. Psychiatric: Mood and affect are normal. Speech and behavior are normal.  ____________________________________________   LABS (all labs ordered are listed, but only abnormal results are displayed)  Labs Reviewed - No data to display   PROCEDURES   INITIAL IMPRESSION /  ASSESSMENT AND PLAN / ED COURSE  Pertinent labs & imaging results that were available during my care of the patient were reviewed by me and considered in my medical decision making (see chart for details).  Well-appearing patient.  No acute distress.  Left lower dental carry and suspect secondary to dental infection noted.  No fluctuant abscess palpated.  1 g IM Rocephin given in urgent care once.  Will treat with patient oral Pen-VK, continue over-the-counter Tylenol and ibuprofen as needed, #5 Percocet given.  [place] controlled substance database reviewed and no recent controlled substances documented.  Discussed and encouraged very close follow-up with dentist.  Local dental information also given. Discussed indication, risks and benefits of medications with patient.   Also advised to take the antibiotic until finished. Instructed to return to the Urgent Care or ER  for symptoms that change or worsen or if unable to schedule an appointment. ____________________________________________   FINAL CLINICAL IMPRESSION(S) / ED DIAGNOSES  Final diagnoses:  Pain, dental  Dental abscess         Renford DillsMiller, Florence Antonelli, NP 06/02/17 1830

## 2017-08-11 ENCOUNTER — Other Ambulatory Visit: Payer: Self-pay

## 2017-08-11 ENCOUNTER — Emergency Department (HOSPITAL_COMMUNITY)
Admission: EM | Admit: 2017-08-11 | Discharge: 2017-08-11 | Disposition: A | Discharge status: Home / Self Care | Payer: Self-pay | Attending: Emergency Medicine | Admitting: Emergency Medicine

## 2017-08-11 ENCOUNTER — Encounter (HOSPITAL_COMMUNITY): Payer: Self-pay | Admitting: Emergency Medicine

## 2017-08-11 DIAGNOSIS — R197 Diarrhea, unspecified: Secondary | ICD-10-CM | POA: Insufficient documentation

## 2017-08-11 DIAGNOSIS — M791 Myalgia, unspecified site: Secondary | ICD-10-CM | POA: Insufficient documentation

## 2017-08-11 LAB — CBC WITH DIFFERENTIAL/PLATELET
Basophils Absolute: 0.1 10*3/uL (ref 0.0–0.1)
Basophils Relative: 1 %
EOS ABS: 0 10*3/uL (ref 0.0–0.7)
Eosinophils Relative: 1 %
HCT: 38.2 % (ref 36.0–46.0)
HEMOGLOBIN: 12.1 g/dL (ref 12.0–15.0)
LYMPHS ABS: 1.9 10*3/uL (ref 0.7–4.0)
Lymphocytes Relative: 23 %
MCH: 26.9 pg (ref 26.0–34.0)
MCHC: 31.7 g/dL (ref 30.0–36.0)
MCV: 85.1 fL (ref 78.0–100.0)
Monocytes Absolute: 0.6 10*3/uL (ref 0.1–1.0)
Monocytes Relative: 7 %
NEUTROS PCT: 68 %
Neutro Abs: 5.5 10*3/uL (ref 1.7–7.7)
Platelets: 388 10*3/uL (ref 150–400)
RBC: 4.49 MIL/uL (ref 3.87–5.11)
RDW: 13.6 % (ref 11.5–15.5)
WBC: 8 10*3/uL (ref 4.0–10.5)

## 2017-08-11 LAB — URINALYSIS, ROUTINE W REFLEX MICROSCOPIC
BACTERIA UA: NONE SEEN
Bilirubin Urine: NEGATIVE
Glucose, UA: NEGATIVE mg/dL
Ketones, ur: NEGATIVE mg/dL
Leukocytes, UA: NEGATIVE
Nitrite: NEGATIVE
PH: 7 (ref 5.0–8.0)
Protein, ur: 100 mg/dL — AB
RBC / HPF: >50 RBC/hpf — ABNORMAL HIGH (ref 0–5)
SPECIFIC GRAVITY, URINE: 1.009 (ref 1.005–1.030)

## 2017-08-11 LAB — COMPREHENSIVE METABOLIC PANEL
ALBUMIN: 4.1 g/dL (ref 3.5–5.0)
ALK PHOS: 73 U/L (ref 38–126)
ALT: 8 U/L — ABNORMAL LOW (ref 14–54)
AST: 15 U/L (ref 15–41)
Anion gap: 7 (ref 5–15)
BUN: 15 mg/dL (ref 6–20)
CALCIUM: 9.3 mg/dL (ref 8.9–10.3)
CO2: 27 mmol/L (ref 22–32)
Chloride: 103 mmol/L (ref 101–111)
Creatinine, Ser: 0.69 mg/dL (ref 0.44–1.00)
GFR calc non Af Amer: >60 mL/min (ref >60)
GLUCOSE: 94 mg/dL (ref 65–99)
Potassium: 3.8 mmol/L (ref 3.5–5.1)
SODIUM: 137 mmol/L (ref 135–145)
Total Bilirubin: 1 mg/dL (ref 0.3–1.2)
Total Protein: 8.2 g/dL — ABNORMAL HIGH (ref 6.5–8.1)

## 2017-08-11 LAB — PREGNANCY, URINE: Preg Test, Ur: NEGATIVE

## 2017-08-11 MED ORDER — METHOCARBAMOL 500 MG PO TABS: 500.0000 mg | ORAL_TABLET | Freq: Three times a day (TID) | ORAL | 0 refills | Status: DC | PRN

## 2017-08-11 MED ORDER — ONDANSETRON HCL 4 MG/2ML IJ SOLN
4.0000 mg | Freq: Once | INTRAMUSCULAR | Status: AC
  Administered 2017-08-11: 4 mg via INTRAVENOUS
  Filled 2017-08-11: qty 2

## 2017-08-11 MED ORDER — SODIUM CHLORIDE 0.9 % IV BOLUS
1000.0000 mL | Freq: Once | INTRAVENOUS | Status: AC
  Administered 2017-08-11: 1000 mL via INTRAVENOUS

## 2017-08-11 MED ORDER — ONDANSETRON HCL 4 MG PO TABS: 4.0000 mg | ORAL_TABLET | Freq: Four times a day (QID) | ORAL | 0 refills | Status: DC | PRN

## 2017-08-11 NOTE — ED Notes (Signed)
Pt to desk requesting a work note through tomorrow

## 2017-08-11 NOTE — ED Triage Notes (Addendum)
Patient complaining of nausea and generalized body aches starting this morning. States she has not vomited but did have diarrhea starting today. States "I was trying to go to work today but they told me I have to have a doctors note to miss work."

## 2017-08-11 NOTE — ED Provider Notes (Signed)
[facility] EMERGENCY DEPARTMENT Provider Note   CSN: [id number] Arrival date & time: 08/11/17  1304     History   Chief Complaint Chief Complaint  Patient presents with  . Nausea    HPI Rebekah Mejia is a 37 y.o. female.  HPI Patient states that she developed generalized body aches, nausea and multiple episodes of diarrhea today.  No blood in the stool.  Denies abdominal pain.  No urinary or vaginal symptoms.  No known sick contacts.  No fever or chills.  Patient states she has chronic neck and low back pain is asking for Vicodin to help her sleep at night. History reviewed. No pertinent past medical history.  There are no active problems to display for this patient.   Past Surgical History:  Procedure Laterality Date  . HERNIA REPAIR       OB History    Gravida  0   Para  0   Term  0   Preterm  0   AB  0   Living  0     SAB  0   TAB  0   Ectopic  0   Multiple  0   Live Births  0            Home Medications    Prior to Admission medications   Medication Sig Start Date End Date Taking? Authorizing Provider  ibuprofen (ADVIL,MOTRIN) 600 MG tablet Take 1 tablet (600 mg total) by mouth every 6 (six) hours as needed. 05/06/16  Yes Eber Hong, MD  methocarbamol (ROBAXIN) 500 MG tablet Take 1 tablet (500 mg total) by mouth every 8 (eight) hours as needed for muscle spasms. 08/11/17   Loren Racer, MD  ondansetron (ZOFRAN) 4 MG tablet Take 1 tablet (4 mg total) by mouth every 6 (six) hours as needed for nausea or vomiting. 08/11/17   Loren Racer, MD    Family History History reviewed. No pertinent family history.  Social History Social History   Tobacco Use  . Smoking status: Never Smoker  . Smokeless tobacco: Never Used  Substance Use Topics  . Alcohol use: No  . Drug use: No     Allergies   Patient has no known allergies.   Review of Systems Review of Systems  Constitutional: Negative for chills and fever.  HENT: Negative  for congestion and trouble swallowing.   Eyes: Negative for visual disturbance.  Respiratory: Negative for cough and shortness of breath.   Cardiovascular: Negative for chest pain.  Gastrointestinal: Positive for diarrhea and nausea. Negative for abdominal pain, constipation and vomiting.  Genitourinary: Positive for flank pain. Negative for dysuria, frequency, hematuria, pelvic pain, vaginal bleeding and vaginal discharge.  Musculoskeletal: Positive for back pain and myalgias. Negative for neck pain and neck stiffness.  Skin: Negative for rash and wound.  Neurological: Negative for dizziness, syncope, weakness, light-headedness, numbness and headaches.  All other systems reviewed and are negative.    Physical Exam Updated Vital Signs BP 127/85   Pulse (!) 58   Temp 98.7 F (37.1 C) (Oral)   Resp 16   Ht 5\' 4"  (1.626 m)   Wt 73.6 kg (162 lb 3 oz)   LMP 08/09/2017   SpO2 100%   BMI 27.84 kg/m   Physical Exam  Constitutional: She is oriented to person, place, and time. She appears well-developed and well-nourished. No distress.  HENT:  Head: Normocephalic and atraumatic.  Mouth/Throat: Oropharynx is clear and moist. No oropharyngeal exudate.  Eyes: Pupils  are equal, round, and reactive to light. EOM are normal.  No nystagmus.  Pupils are 5 mm and reactive.  Neck: Normal range of motion. Neck supple.  No meningismus.  Cardiovascular: Normal rate and regular rhythm. Exam reveals no gallop and no friction rub.  No murmur heard. Pulmonary/Chest: Effort normal and breath sounds normal. No stridor. No respiratory distress. She has no wheezes. She has no rales. She exhibits no tenderness.  Abdominal: Soft. Bowel sounds are normal. There is no tenderness. There is no rebound and no guarding.  Musculoskeletal: Normal range of motion. She exhibits no edema or tenderness.  No midline thoracic or lumbar tenderness.  Patient has mild bilateral CVA tenderness.  No lower extremity swelling,  asymmetry or tenderness.  Neurological: She is alert and oriented to person, place, and time.  Patient is alert and oriented x3 with clear, goal oriented speech. Patient has 5/5 motor in all extremities. Sensation is intact to light touch. Bilateral finger-to-nose is normal with no signs of dysmetria.   Skin: Skin is warm and dry. Capillary refill takes less than 2 seconds. No rash noted. She is not diaphoretic. No erythema.  Psychiatric: She has a normal mood and affect. Her behavior is normal.  Nursing note and vitals reviewed.    ED Treatments / Results  Labs (all labs ordered are listed, but only abnormal results are displayed) Labs Reviewed  COMPREHENSIVE METABOLIC PANEL - Abnormal; Notable for the following components:      Result Value   Total Protein 8.2 (*)    ALT 8 (*)    All other components within normal limits  URINALYSIS, ROUTINE W REFLEX MICROSCOPIC - Abnormal; Notable for the following components:   Color, Urine AMBER (*)    APPearance CLOUDY (*)    Hgb urine dipstick LARGE (*)    Protein, ur 100 (*)    RBC / HPF >50 (*)    All other components within normal limits  PREGNANCY, URINE  CBC WITH DIFFERENTIAL/PLATELET    EKG None  Radiology No results found.  Procedures Procedures (including critical care time)  Medications Ordered in ED Medications  sodium chloride 0.9 % bolus 1,000 mL (1,000 mLs Intravenous New Bag/Given 08/11/17 1522)  ondansetron (ZOFRAN) injection 4 mg (4 mg Intravenous Given 08/11/17 1522)     Initial Impression / Assessment and Plan / ED Course  I have reviewed the triage vital signs and the nursing notes.  Pertinent labs & imaging results that were available during my care of the patient were reviewed by me and considered in my medical decision making (see chart for details).     Patient is on her period which would account for the blood in her urine.  Low suspicion for renal stone.  Electrolytes are normal.  Patient again asking  for "stronger pain medication".  Drug-seeking suspected.  Advised ibuprofen, Tylenol and will give prescription for muscle relaxant for her chronic muscular back pain.  Return precautions have been given.  Final Clinical Impressions(s) / ED Diagnoses   Final diagnoses:  Myalgia  Diarrhea, unspecified type    ED Discharge Orders        Ordered    methocarbamol (ROBAXIN) 500 MG tablet  Every 8 hours PRN     08/11/17 1734    ondansetron (ZOFRAN) 4 MG tablet  Every 6 hours PRN     08/11/17 1734       Loren RacerYelverton, Rachid Parham, MD 08/11/17 1737

## 2017-08-11 NOTE — ED Notes (Signed)
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

## 2017-08-11 NOTE — ED Notes (Signed)
Pt without vomiting at this time

## 2017-08-11 NOTE — ED Notes (Signed)
Pt reports that she is stressed in her job and that she is not treated well  When asked if she feels she should seek other employment, pt reports other people have told her that she should consider  Pt again thanks RN for work excuse

## 2017-12-18 ENCOUNTER — Emergency Department (HOSPITAL_COMMUNITY)
Admission: EM | Admit: 2017-12-18 | Discharge: 2017-12-18 | Disposition: A | Discharge status: Home / Self Care | Payer: Self-pay | Attending: Emergency Medicine | Admitting: Emergency Medicine

## 2017-12-18 ENCOUNTER — Other Ambulatory Visit: Payer: Self-pay

## 2017-12-18 ENCOUNTER — Encounter (HOSPITAL_COMMUNITY): Payer: Self-pay

## 2017-12-18 DIAGNOSIS — K029 Dental caries, unspecified: Secondary | ICD-10-CM | POA: Insufficient documentation

## 2017-12-18 DIAGNOSIS — K0889 Other specified disorders of teeth and supporting structures: Secondary | ICD-10-CM

## 2017-12-18 MED ORDER — ACETAMINOPHEN 500 MG PO TABS
1000.0000 mg | ORAL_TABLET | Freq: Once | ORAL | Status: AC
Start: 2017-12-18 — End: 2017-12-18
  Administered 2017-12-18: 1000 mg via ORAL
  Filled 2017-12-18: qty 2

## 2017-12-18 MED ORDER — CLINDAMYCIN HCL 150 MG PO CAPS
300.0000 mg | ORAL_CAPSULE | Freq: Once | ORAL | Status: AC
  Administered 2017-12-18: 300 mg via ORAL
  Filled 2017-12-18: qty 2

## 2017-12-18 MED ORDER — AMOXICILLIN 500 MG PO CAPS: 500.0000 mg | ORAL_CAPSULE | Freq: Three times a day (TID) | ORAL | 0 refills | Status: DC

## 2017-12-18 MED ORDER — IBUPROFEN 800 MG PO TABS
800.0000 mg | ORAL_TABLET | Freq: Once | ORAL | Status: AC
  Administered 2017-12-18: 800 mg via ORAL
  Filled 2017-12-18: qty 1

## 2017-12-18 MED ORDER — TRAMADOL HCL 50 MG PO TABS: ORAL_TABLET | ORAL | 0 refills | Status: DC

## 2017-12-18 MED ORDER — ONDANSETRON HCL 4 MG PO TABS
4.0000 mg | ORAL_TABLET | Freq: Once | ORAL | Status: AC
  Administered 2017-12-18: 4 mg via ORAL
  Filled 2017-12-18: qty 1

## 2017-12-18 NOTE — ED Provider Notes (Signed)
[facility] EMERGENCY DEPARTMENT Provider Note   CSN: [id number] Arrival date & time: 12/18/17  1555     History   Chief Complaint Chief Complaint  Patient presents with  . Dental Pain    HPI Shawnte C Mejia is a 37 y.o. female.  Patient is a 37 year old female who presents to the emergency department with a complaint of dental problem.  The patient states that she has had problems with her lower teeth for quite some time, in the last week the problem is gotten progressively worse.  She describes a throbbing pain over the lower molar area.  She says that she feels as though there is some swelling of her jaw extending down into her neck at times.  She says that she has pain both at rest, when she is attempting to chew, or even when she just opens her mouth.  She has not had fever or chills.  She has been able to manage her secretions.  No difficulty with breathing or swallowing reported.  She says she is attempting to get to a dentist, but she cannot afford it right at this time.  The history is provided by the patient.    History reviewed. No pertinent past medical history.  There are no active problems to display for this patient.   Past Surgical History:  Procedure Laterality Date  . HERNIA REPAIR       OB History    Gravida  0   Para  0   Term  0   Preterm  0   AB  0   Living  0     SAB  0   TAB  0   Ectopic  0   Multiple  0   Live Births  0            Home Medications    Prior to Admission medications   Medication Sig Start Date End Date Taking? Authorizing Provider  ibuprofen (ADVIL,MOTRIN) 600 MG tablet Take 1 tablet (600 mg total) by mouth every 6 (six) hours as needed. 05/06/16   Eber Hong, MD  methocarbamol (ROBAXIN) 500 MG tablet Take 1 tablet (500 mg total) by mouth every 8 (eight) hours as needed for muscle spasms. 08/11/17   Loren Racer, MD  ondansetron (ZOFRAN) 4 MG tablet Take 1 tablet (4 mg total) by mouth every 6 (six)  hours as needed for nausea or vomiting. 08/11/17   Loren Racer, MD    Family History No family history on file.  Social History Social History   Tobacco Use  . Smoking status: Never Smoker  . Smokeless tobacco: Never Used  Substance Use Topics  . Alcohol use: No  . Drug use: No     Allergies   Patient has no known allergies.   Review of Systems Review of Systems  Constitutional: Negative for activity change.       All ROS Neg except as noted in HPI  HENT: Positive for dental problem and facial swelling. Negative for nosebleeds.   Eyes: Negative for photophobia and discharge.  Respiratory: Negative for cough, shortness of breath and wheezing.   Cardiovascular: Negative for chest pain and palpitations.  Gastrointestinal: Negative for abdominal pain and blood in stool.  Genitourinary: Negative for dysuria, frequency and hematuria.  Musculoskeletal: Negative for arthralgias, back pain and neck pain.  Skin: Negative.   Neurological: Positive for headaches. Negative for dizziness, seizures and speech difficulty.  Psychiatric/Behavioral: Negative for confusion and hallucinations.  Physical Exam Updated Vital Signs BP (!) 143/95 (BP Location: Right Arm)   Pulse 63   Temp 98.4 F (36.9 C) (Oral)   Resp 16   Wt 73.6 kg   LMP 12/13/2017   SpO2 99%   BMI 27.85 kg/m   Physical Exam  Constitutional: She is oriented to person, place, and time. She appears well-developed and well-nourished.  Non-toxic appearance.  HENT:  Head: Normocephalic.  Right Ear: Tympanic membrane and external ear normal.  Left Ear: Tympanic membrane and external ear normal.  The oropharynx is clear.  Uvula is in the midline.  The patient has a cavity of left lower molar.  There is swelling of the gum around the area, but no visible abscess.  There is no swelling under the tongue.  Patient has pain with opening her mouth, but no trismus noted.  Lips are dry.  Eyes: Pupils are equal, round,  and reactive to light. EOM and lids are normal.  Neck: Normal range of motion. Neck supple. Carotid bruit is not present.  Cardiovascular: Normal rate, regular rhythm, normal heart sounds, intact distal pulses and normal pulses.  Pulmonary/Chest: Breath sounds normal. No respiratory distress.  Abdominal: Soft. Bowel sounds are normal. There is no tenderness. There is no guarding.  Musculoskeletal: Normal range of motion.  Lymphadenopathy:       Head (right side): No submandibular adenopathy present.       Head (left side): No submandibular adenopathy present.    She has no cervical adenopathy.  Neurological: She is alert and oriented to person, place, and time. She has normal strength. No cranial nerve deficit or sensory deficit.  Skin: Skin is warm and dry.  Psychiatric: She has a normal mood and affect. Her speech is normal.  Nursing note and vitals reviewed.    ED Treatments / Results  Labs (all labs ordered are listed, but only abnormal results are displayed) Labs Reviewed - No data to display  EKG None  Radiology No results found.  Procedures Procedures (including critical care time)  Medications Ordered in ED Medications - No data to display   Initial Impression / Assessment and Plan / ED Course  I have reviewed the triage vital signs and the nursing notes.  Pertinent labs & imaging results that were available during my care of the patient were reviewed by me and considered in my medical decision making (see chart for details).       Final Clinical Impressions(s) / ED Diagnoses MDM  Vital signs reviewed.  Pulse oximetry is 99% on room air.  Within normal limits by my interpretation.  The patient has pain to palpation of the left jaw.  She has pain with opening her mouth, but there is no trismus noted.  There is no evidence of Ludwig's angina present, or other emergent changes at this time. Patient will be treated with Amoxil, ibuprofen, and Ultram.  Patient is  given dental resources.   Final diagnoses:  Dental caries  Pain, dental    ED Discharge Orders    None       Ivery Quale, PA-C 12/20/17 1037    Bethann Berkshire, MD 12/20/17 1137

## 2017-12-18 NOTE — Discharge Instructions (Signed)
You have a cavity of the lower molars on your left side.  It is important that you see a dentist as soon as possible.  Please use Amoxil 3 times daily.  Please use 600 mg of ibuprofen with breakfast, lunch, dinner, and at bedtime.  May use Ultram for more severe pain. This medication may cause drowsiness. Please do not drink, drive, or participate in activity that requires concentration while taking this medication.  A list of dental resources has been added to yourDischarge instructions.

## 2017-12-18 NOTE — ED Triage Notes (Signed)
Pt reports lower dental pain for over a week. Pt reports tooth is throbbing. Noted to have swelling to left cheek area

## 2018-04-19 IMAGING — DX DG THORACIC SPINE 2V
4 series · 4 of 4 positions shown · non-contrast
Comparison: None.

CLINICAL DATA: 36 y/o  F; status post fall with pain.

EXAM:
THORACIC SPINE 2 VIEWS

[t-spine ap (1 of 2)]
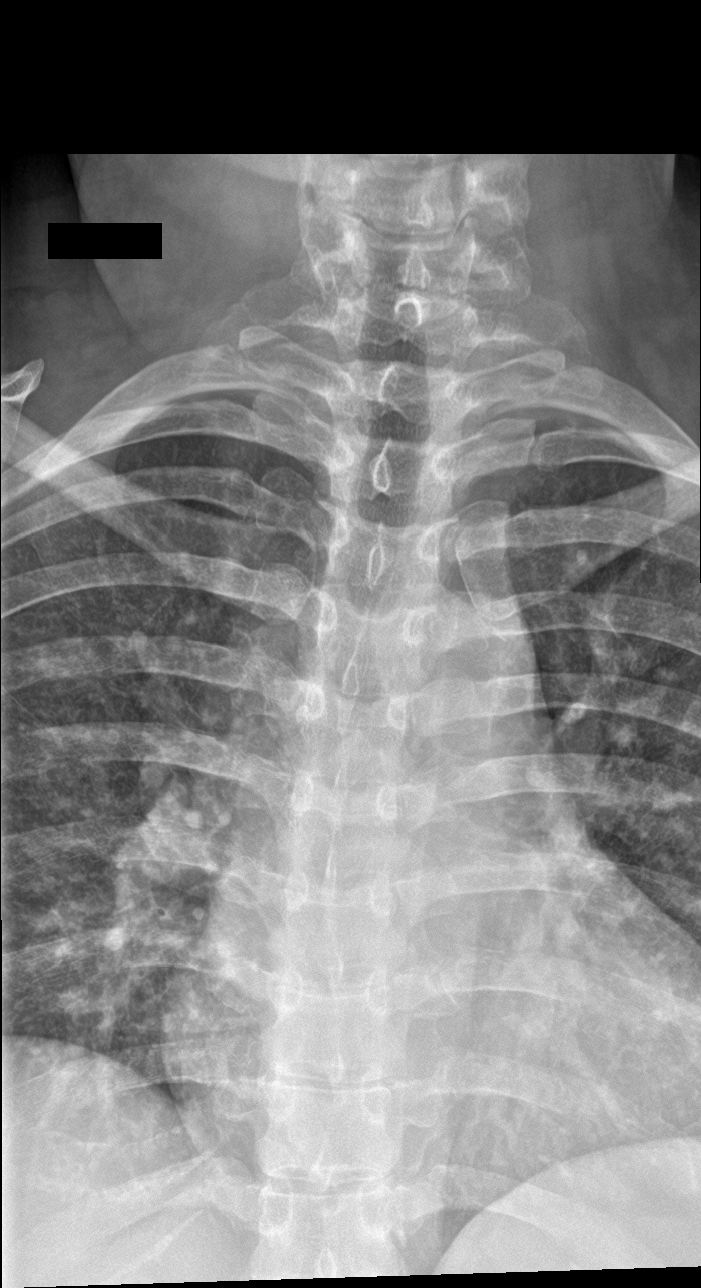

[t-spine lat (1 of 2)]
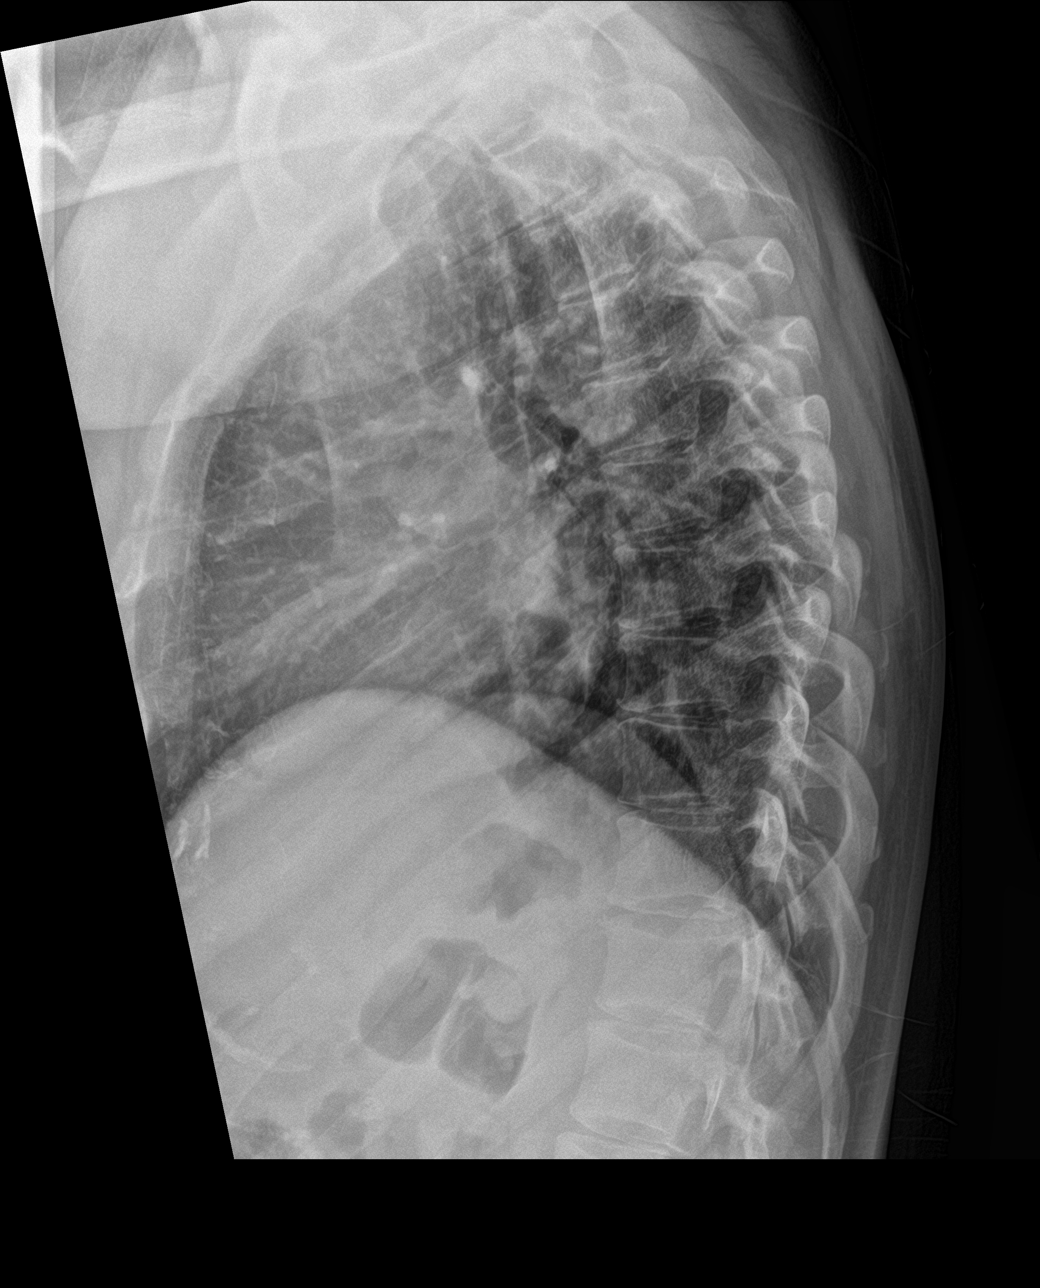

[t-spine ap (2 of 2)]
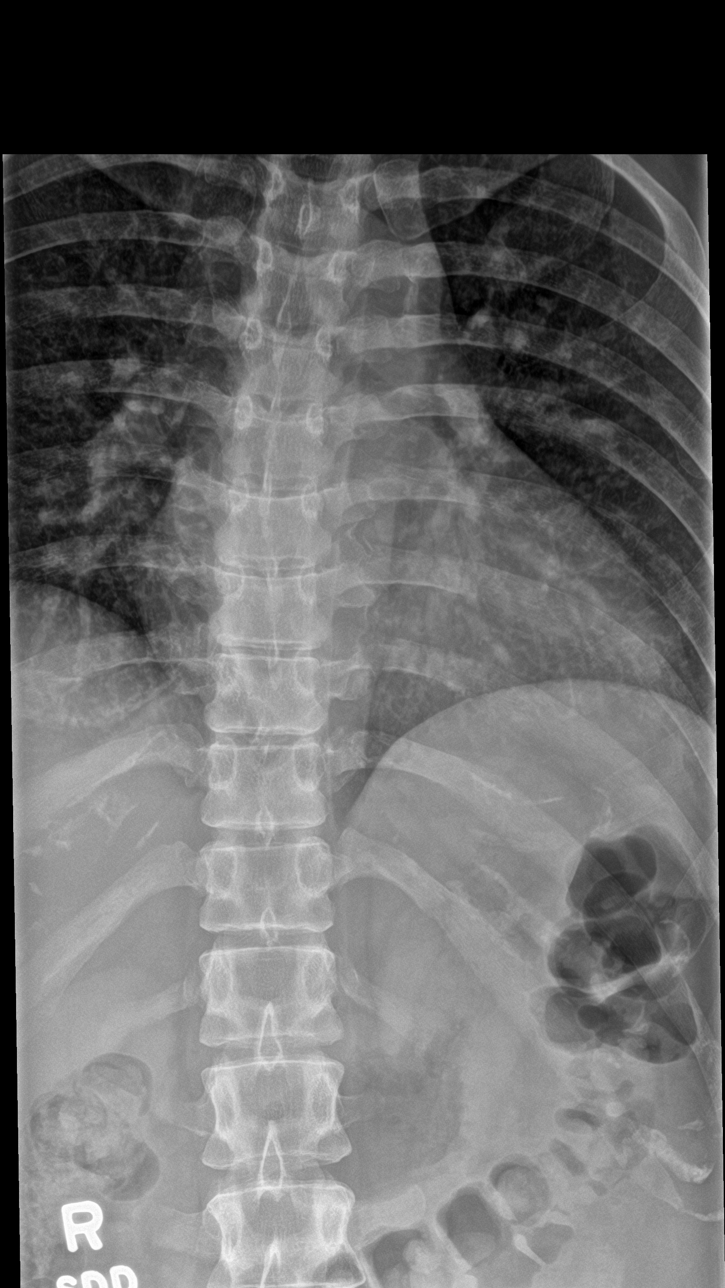

[t-spine lat (2 of 2)]
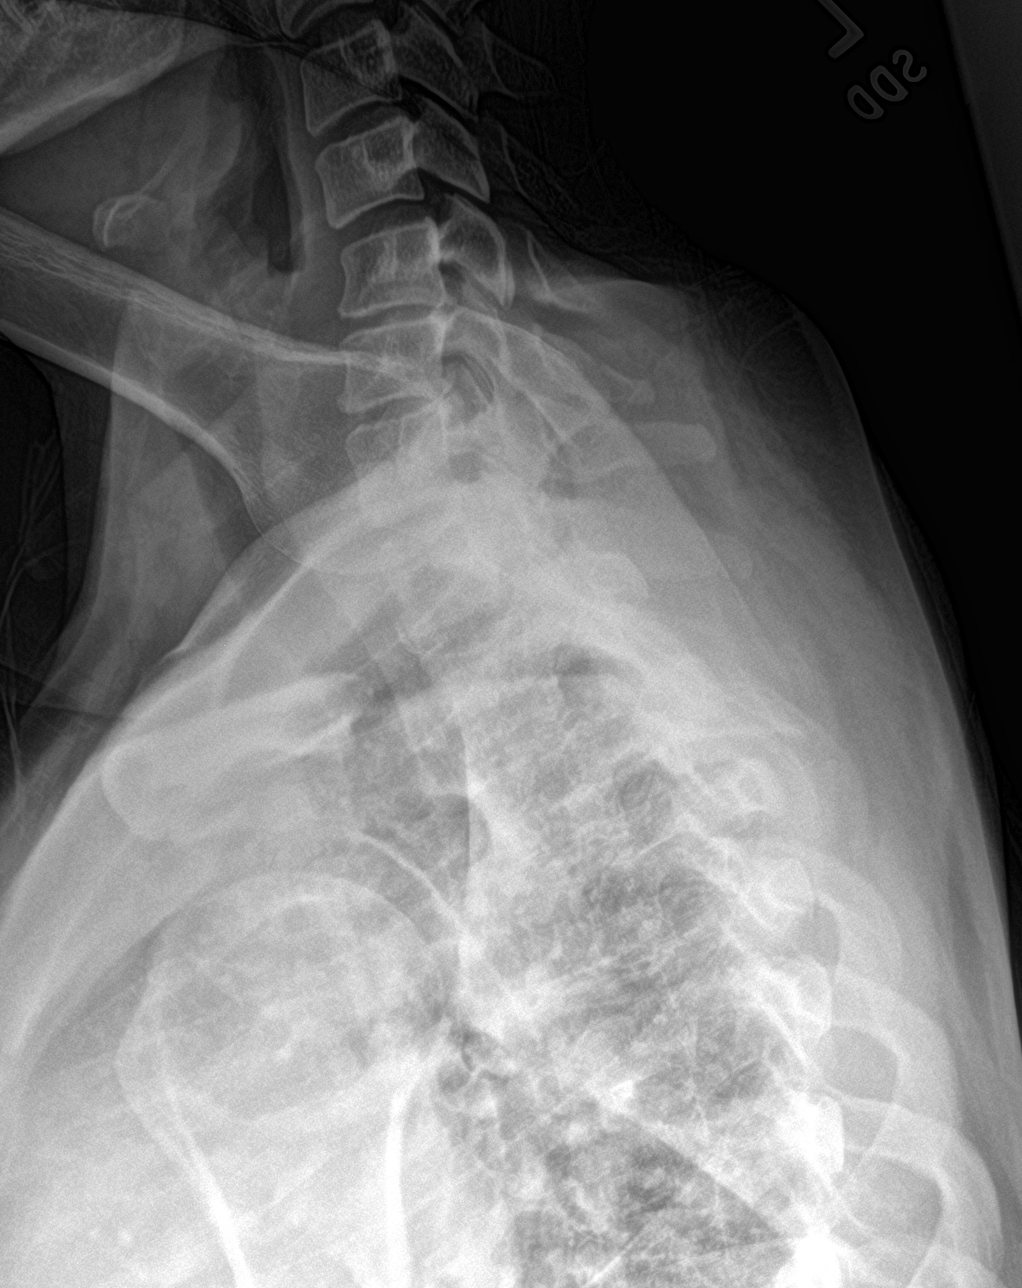

[4 of 4 positions shown; findings below may reference images not displayed]

FINDINGS: There is no evidence of thoracic spine fracture. Alignment is
normal. No other significant bone abnormalities are identified.
IMPRESSION: Negative.

By: Isaac Sanguengue E[place]ka M.D.

## 2022-02-17 ENCOUNTER — Encounter: Payer: Self-pay | Admitting: Emergency Medicine

## 2022-02-17 ENCOUNTER — Ambulatory Visit
Admission: EM | Admit: 2022-02-17 | Discharge: 2022-02-17 | Disposition: A | Discharge status: Home / Self Care | Payer: BC Managed Care – PPO | Attending: Nurse Practitioner | Admitting: Nurse Practitioner

## 2022-02-17 DIAGNOSIS — Z1152 Encounter for screening for COVID-19: Secondary | ICD-10-CM | POA: Insufficient documentation

## 2022-02-17 DIAGNOSIS — J069 Acute upper respiratory infection, unspecified: Secondary | ICD-10-CM | POA: Insufficient documentation

## 2022-02-17 MED ORDER — PROMETHAZINE-DM 6.25-15 MG/5ML PO SYRP: 5.0000 mL | ORAL_SOLUTION | Freq: Every evening | ORAL | 0 refills | Status: DC | PRN

## 2022-02-17 MED ORDER — BENZONATATE 100 MG PO CAPS: 100.0000 mg | ORAL_CAPSULE | Freq: Three times a day (TID) | ORAL | 0 refills | Status: DC | PRN

## 2022-02-17 NOTE — Discharge Instructions (Addendum)
The cough is most likely lingering from a viral upper respiratory infection.  Symptoms should improve over the next week to 10 days.  If you develop chest pain or shortness of breath, go to the emergency room.  We have tested you today for COVID-19.  You will see the results in Mychart and we will call you with positive results.      Some things that can make you feel better are: - Increased rest - Increasing fluid with water/sugar free electrolytes - Acetaminophen and ibuprofen as needed for fever/pain - Salt water gargling, chloraseptic spray and throat lozenges - OTC guaifenesin (Mucinex) 600 mg twice daily - Saline sinus flushes or a neti pot - Humidifying the air -Tessalon Perles during the day as needed for dry cough and cough syrup at nighttime as needed for dry cough

## 2022-02-17 NOTE — ED Triage Notes (Signed)
States she wants a covid test done and states she needs a doctors note.

## 2022-02-17 NOTE — ED Triage Notes (Signed)
Cough x 2 weeks

## 2022-02-17 NOTE — ED Provider Notes (Signed)
[facility]    CSN: [id number] Arrival date & time: 02/17/22  1502      History   Chief Complaint No chief complaint on file.   HPI Rebekah Mejia is a 41 y.o. female.   Patient presents for 2 weeks of dry cough.  Also endorses a little bit of nasal congestion and sore throat occasionally.  No fever, shortness of breath or chest pain, chest tightness or chest congestion, headache, ear pain, abdominal pain, nausea/vomiting, diarrhea, decreased appetite, new rash, or fatigue.  No known sick contacts.  Has been taking over-the-counter cough medication with minimal relief.  Reports her employer is requesting to be tested for COVID-19 before she goes back to work.  Also needs a note for work.    History reviewed. No pertinent past medical history.  There are no problems to display for this patient.   Past Surgical History:  Procedure Laterality Date   HERNIA REPAIR      OB History     Gravida  0   Para  0   Term  0   Preterm  0   AB  0   Living  0      SAB  0   IAB  0   Ectopic  0   Multiple  0   Live Births  0            Home Medications    Prior to Admission medications   Medication Sig Start Date End Date Taking? Authorizing Provider  benzonatate (TESSALON) 100 MG capsule Take 1 capsule (100 mg total) by mouth 3 (three) times daily as needed for cough. Do not take with alcohol or while driving or operating heavy machinery.  May cause drowsiness. 02/17/22  Yes Valentino Nose, NP  promethazine-dextromethorphan (PROMETHAZINE-DM) 6.25-15 MG/5ML syrup Take 5 mLs by mouth at bedtime as needed for cough. Do not take with alcohol or while driving or operating heavy machinery.  May cause drowsiness 02/17/22  Yes Cathlean Marseilles A, NP  ibuprofen (ADVIL,MOTRIN) 600 MG tablet Take 1 tablet (600 mg total) by mouth every 6 (six) hours as needed. 05/06/16   Eber Hong, MD  methocarbamol (ROBAXIN) 500 MG tablet Take 1 tablet (500 mg total)  by mouth every 8 (eight) hours as needed for muscle spasms. 08/11/17   Loren Racer, MD  ondansetron (ZOFRAN) 4 MG tablet Take 1 tablet (4 mg total) by mouth every 6 (six) hours as needed for nausea or vomiting. 08/11/17   Loren Racer, MD  traMADol Janean Sark) 50 MG tablet 1 or 2 po q6h prn pain 12/18/17   Ivery Quale, PA-C    Family History History reviewed. No pertinent family history.  Social History Social History   Tobacco Use   Smoking status: Never   Smokeless tobacco: Never  Vaping Use   Vaping Use: Never used  Substance Use Topics   Alcohol use: No   Drug use: No     Allergies   Patient has no known allergies.   Review of Systems Review of Systems Per HPI  Physical Exam Triage Vital Signs ED Triage Vitals [02/17/22 1517]  Enc Vitals Group     BP (!) 140/100     Pulse Rate 87     Resp 18     Temp 98.2 F (36.8 C)     Temp Source Oral     SpO2 97 %     Weight      Height  Head Circumference      Peak Flow      Pain Score 0     Pain Loc      Pain Edu?      Excl. in GC?    No data found.  Updated Vital Signs BP (!) 140/100 (BP Location: Right Arm)   Pulse 87   Temp 98.2 F (36.8 C) (Oral)   Resp 18   LMP 02/07/2022 (Exact Date)   SpO2 97%   Visual Acuity Right Eye Distance:   Left Eye Distance:   Bilateral Distance:    Right Eye Near:   Left Eye Near:    Bilateral Near:     Physical Exam Vitals and nursing note reviewed.  Constitutional:      General: She is not in acute distress.    Appearance: Normal appearance. She is not ill-appearing or toxic-appearing.  HENT:     Head: Normocephalic and atraumatic.     Right Ear: Tympanic membrane, ear canal and external ear normal.     Left Ear: Tympanic membrane, ear canal and external ear normal.     Nose: No congestion or rhinorrhea.     Right Sinus: No maxillary sinus tenderness or frontal sinus tenderness.     Left Sinus: No maxillary sinus tenderness or frontal sinus  tenderness.     Mouth/Throat:     Mouth: Mucous membranes are moist.     Pharynx: Oropharynx is clear. No oropharyngeal exudate or posterior oropharyngeal erythema.  Eyes:     General: No scleral icterus.    Extraocular Movements: Extraocular movements intact.  Cardiovascular:     Rate and Rhythm: Normal rate and regular rhythm.  Pulmonary:     Effort: Pulmonary effort is normal. No respiratory distress.     Breath sounds: Normal breath sounds. No wheezing, rhonchi or rales.  Abdominal:     General: Abdomen is flat. Bowel sounds are normal. There is no distension.     Palpations: Abdomen is soft.     Tenderness: There is no abdominal tenderness. There is no guarding.  Musculoskeletal:     Cervical back: Normal range of motion and neck supple.  Lymphadenopathy:     Cervical: No cervical adenopathy.  Skin:    General: Skin is warm and dry.     Coloration: Skin is not jaundiced or pale.     Findings: No erythema or rash.  Neurological:     Mental Status: She is alert and oriented to person, place, and time.  Psychiatric:        Behavior: Behavior is cooperative.      UC Treatments / Results  Labs (all labs ordered are listed, but only abnormal results are displayed) Labs Reviewed  SARS CORONAVIRUS 2 (TAT 6-24 HRS)    EKG   Radiology No results found.  Procedures Procedures (including critical care time)  Medications Ordered in UC Medications - No data to display  Initial Impression / Assessment and Plan / UC Course  I have reviewed the triage vital signs and the nursing notes.  Pertinent labs & imaging results that were available during my care of the patient were reviewed by me and considered in my medical decision making (see chart for details).   Patient is well-appearing, normotensive, afebrile, not tachycardic, not tachypneic, oxygenating well on room air.    Viral URI with cough Encounter for screening for COVID-19 Suspect viral etiology Discussed with  patient that COVID-19 testing is not indicated since symptoms have been going on  for more than 2 weeks Patient is adamant about requesting for COVID-19 since her employer is requesting it COVID-19 test performed Patient is not a candidate for antiviral if she test positive Supportive care discussed with patient Start cough suppressant medicine ER and return precautions discussed Note given for work-patient initially asked for work note through Tuesday, I explained to her that I cannot give her a work note for more than 3 days in a row per our policy.  The patient was given the opportunity to ask questions.  All questions answered to their satisfaction.  The patient is in agreement to this plan.    Final Clinical Impressions(s) / UC Diagnoses   Final diagnoses:  Viral URI with cough  Encounter for screening for COVID-19     Discharge Instructions      The cough is most likely lingering from a viral upper respiratory infection.  Symptoms should improve over the next week to 10 days.  If you develop chest pain or shortness of breath, go to the emergency room.  We have tested you today for COVID-19.  You will see the results in Mychart and we will call you with positive results.      Some things that can make you feel better are: - Increased rest - Increasing fluid with water/sugar free electrolytes - Acetaminophen and ibuprofen as needed for fever/pain - Salt water gargling, chloraseptic spray and throat lozenges - OTC guaifenesin (Mucinex) 600 mg twice daily - Saline sinus flushes or a neti pot - Humidifying the air -Tessalon Perles during the day as needed for dry cough and cough syrup at nighttime as needed for dry cough     ED Prescriptions     Medication Sig Dispense Auth. Provider   benzonatate (TESSALON) 100 MG capsule Take 1 capsule (100 mg total) by mouth 3 (three) times daily as needed for cough. Do not take with alcohol or while driving or operating heavy machinery.   May cause drowsiness. 21 capsule Cathlean Marseilles A, NP   promethazine-dextromethorphan (PROMETHAZINE-DM) 6.25-15 MG/5ML syrup Take 5 mLs by mouth at bedtime as needed for cough. Do not take with alcohol or while driving or operating heavy machinery.  May cause drowsiness 118 mL Valentino Nose, NP      PDMP not reviewed this encounter.   Valentino Nose, NP 02/17/22 1547

## 2022-02-18 LAB — SARS CORONAVIRUS 2 (TAT 6-24 HRS): SARS Coronavirus 2: NEGATIVE

## 2022-10-08 ENCOUNTER — Ambulatory Visit
Admission: EM | Admit: 2022-10-08 | Discharge: 2022-10-08 | Disposition: A | Discharge status: Home / Self Care | Payer: BC Managed Care – PPO | Attending: Nurse Practitioner | Admitting: Nurse Practitioner

## 2022-10-08 ENCOUNTER — Encounter: Payer: Self-pay | Admitting: Emergency Medicine

## 2022-10-08 DIAGNOSIS — U071 COVID-19: Secondary | ICD-10-CM | POA: Insufficient documentation

## 2022-10-08 DIAGNOSIS — Z1152 Encounter for screening for COVID-19: Secondary | ICD-10-CM | POA: Insufficient documentation

## 2022-10-08 MED ORDER — PROMETHAZINE-DM 6.25-15 MG/5ML PO SYRP: 5.0000 mL | ORAL_SOLUTION | Freq: Four times a day (QID) | ORAL | 0 refills | Status: AC | PRN

## 2022-10-08 MED ORDER — PAXLOVID (300/100) 20 X 150 MG & 10 X 100MG PO TBPK: 3.0000 | ORAL_TABLET | Freq: Two times a day (BID) | ORAL | 0 refills | Status: AC

## 2022-10-08 MED ORDER — FLUTICASONE PROPIONATE 50 MCG/ACT NA SUSP: 2.0000 | Freq: Every day | NASAL | 0 refills | Status: AC

## 2022-10-08 NOTE — ED Triage Notes (Signed)
States uncle tested positive for covid.  States she took a home covid test last night and it was positive.  Nasal congestion, headache, body aches  and cough x 2 days.

## 2022-10-08 NOTE — Discharge Instructions (Addendum)
COVID test is pending.  You will be contacted if the pending test result is positive.  Per our discussion, you decline antiviral treatment. Take medication as prescribed. Increase fluids and allow for plenty of rest.  As discussed, try to drink at least 8-10 8 ounce glasses of water daily while symptoms persist. Recommend over-the-counter ibuprofen or Tylenol as needed for pain, fever, general discomfort. Normal saline nasal spray throughout the day to help with nasal congestion and runny nose. For your cough, recommend using a humidifier in your bedroom at nighttime during sleep and sleeping elevated on pillows while cough symptoms persist. Go to the emergency department immediately if you experience a high fever greater than 102, worsening cough, shortness of breath, difficulty breathing, or other concerns. As discussed, continue to wear your mask while you are experiencing symptoms.  You will need to remain home if you develop a fever.  Do not return to normal activities around others until you have been fever free for 24 hours with no medication. Follow-up as needed.

## 2022-10-08 NOTE — ED Provider Notes (Addendum)
[facility]    CSN: [id number] Arrival date & time: 10/08/22  7829      History   Chief Complaint No chief complaint on file.   HPI Rebekah Mejia is a 42 y.o. female.   The history is provided by the patient.   The patient presents with a 2-day history of fatigue, body aches, headache, nasal congestion, and cough.  Patient denies fever, sore throat, ear pain, wheezing, shortness of breath, difficulty breathing, chest pain, abdominal pain, nausea, vomiting, or diarrhea.  Patient states her uncle tested positive for COVID, and she took a home COVID test last evening which was positive.  She reports she has been taking over-the-counter cough and cold medications for her symptoms.  Patient reports that she would like to be retested.  History reviewed. No pertinent past medical history.  There are no problems to display for this patient.   Past Surgical History:  Procedure Laterality Date   HERNIA REPAIR      OB History     Gravida  0   Para  0   Term  0   Preterm  0   AB  0   Living  0      SAB  0   IAB  0   Ectopic  0   Multiple  0   Live Births  0            Home Medications    Prior to Admission medications   Medication Sig Start Date End Date Taking? Authorizing Provider  fluticasone (FLONASE) 50 MCG/ACT nasal spray Place 2 sprays into both nostrils daily. 10/08/22  Yes Daimion Adamcik-Warren, Rebekah Haber, NP  promethazine-dextromethorphan (PROMETHAZINE-DM) 6.25-15 MG/5ML syrup Take 5 mLs by mouth 4 (four) times daily as needed. 10/08/22  Yes Ajee Heasley-Warren, Rebekah Haber, NP  ibuprofen (ADVIL,MOTRIN) 600 MG tablet Take 1 tablet (600 mg total) by mouth every 6 (six) hours as needed. 05/06/16   Eber Hong, MD    Family History History reviewed. No pertinent family history.  Social History Social History   Tobacco Use   Smoking status: Never   Smokeless tobacco: Never  Vaping Use   Vaping status: Never Used  Substance Use Topics   Alcohol  use: No   Drug use: No     Allergies   Patient has no known allergies.   Review of Systems Review of Systems Per HPI  Physical Exam Triage Vital Signs ED Triage Vitals  Encounter Vitals Group     BP 10/08/22 1043 (!) 141/93     Systolic BP Percentile --      Diastolic BP Percentile --      Pulse Rate 10/08/22 1043 75     Resp 10/08/22 1043 18     Temp 10/08/22 1043 98 F (36.7 C)     Temp Source 10/08/22 1043 Oral     SpO2 10/08/22 1043 97 %     Weight --      Height --      Head Circumference --      Peak Flow --      Pain Score 10/08/22 1045 6     Pain Loc --      Pain Education --      Exclude from Growth Chart --    No data found.  Updated Vital Signs BP (!) 141/93 (BP Location: Right Arm)   Pulse 75   Temp 98 F (36.7 C) (Oral)   Resp 18   LMP 09/28/2022 (  Approximate)   SpO2 97%   Visual Acuity Right Eye Distance:   Left Eye Distance:   Bilateral Distance:    Right Eye Near:   Left Eye Near:    Bilateral Near:     Physical Exam Vitals and nursing note reviewed.  Constitutional:      General: She is not in acute distress.    Appearance: Normal appearance.  HENT:     Head: Normocephalic.     Right Ear: Tympanic membrane, ear canal and external ear normal.     Left Ear: Tympanic membrane, ear canal and external ear normal.     Nose: Congestion present.     Right Turbinates: Enlarged and swollen.     Left Turbinates: Enlarged and swollen.     Right Sinus: No maxillary sinus tenderness or frontal sinus tenderness.     Left Sinus: No maxillary sinus tenderness or frontal sinus tenderness.     Mouth/Throat:     Lips: Pink.     Mouth: Mucous membranes are moist.     Pharynx: Oropharynx is clear. Uvula midline. Posterior oropharyngeal erythema and postnasal drip present. No pharyngeal swelling, oropharyngeal exudate or uvula swelling.     Comments: Cobblestoning present to posterior oropharynx Eyes:     Extraocular Movements: Extraocular  movements intact.     Conjunctiva/sclera: Conjunctivae normal.     Pupils: Pupils are equal, round, and reactive to light.  Cardiovascular:     Rate and Rhythm: Normal rate and regular rhythm.     Pulses: Normal pulses.     Heart sounds: Normal heart sounds.  Pulmonary:     Effort: Pulmonary effort is normal.     Breath sounds: Normal breath sounds.  Abdominal:     General: Bowel sounds are normal.     Palpations: Abdomen is soft.     Tenderness: There is no abdominal tenderness.  Musculoskeletal:     Cervical back: Normal range of motion.  Lymphadenopathy:     Cervical: No cervical adenopathy.  Skin:    General: Skin is warm and dry.  Neurological:     General: No focal deficit present.     Mental Status: She is alert and oriented to person, place, and time.  Psychiatric:        Mood and Affect: Mood normal.        Behavior: Behavior normal.      UC Treatments / Results  Labs (all labs ordered are listed, but only abnormal results are displayed) Labs Reviewed  SARS CORONAVIRUS 2 (TAT 6-24 HRS)    EKG   Radiology No results found.  Procedures Procedures (including critical care time)  Medications Ordered in UC Medications - No data to display  Initial Impression / Assessment and Plan / UC Course  I have reviewed the triage vital signs and the nursing notes.  Pertinent labs & imaging results that were available during my care of the patient were reviewed by me and considered in my medical decision making (see chart for details).  The patient is well-appearing, she is in no acute distress, vital signs are stable.  Patient with positive self-administered home COVID test.  COVID test is pending.  Symptoms have been present for the past 2 days.  She is requesting a retest.  Patient declines antiviral therapy, would like symptomatic treatment.  Will treat patient with Promethazine DM for her cough, and fluticasone 50 mcg nasal spray for her nasal congestion and runny  nose.  Supportive care recommendations were provided  and discussed with the patient to include increasing fluids, allowing for plenty of rest, use of over-the-counter analgesics, and use of a humidifier in her bedroom at nighttime during sleep for cough.  Patient was advised if symptoms worsen to include wheezing, shortness of breath, difficulty breathing, or other concerns, it is recommended that she follow-up in the emergency department.  Patient is in agreement with this plan of care and verbalizes understanding.  All questions were answered.  Patient stable for discharge.  Work note was provided.   After the patient was discharged, she returned requesting the antiviral treatment be prescribed.  Patient was sent a prescription for Paxlovid. Final Clinical Impressions(s) / UC Diagnoses   Final diagnoses:  Positive self-administered antigen test for COVID-19  Encounter for screening for COVID-19     Discharge Instructions      COVID test is pending.  You will be contacted if the pending test result is positive.  Per our discussion, you decline antiviral treatment. Take medication as prescribed. Increase fluids and allow for plenty of rest.  As discussed, try to drink at least 8-10 8 ounce glasses of water daily while symptoms persist. Recommend over-the-counter ibuprofen or Tylenol as needed for pain, fever, general discomfort. Normal saline nasal spray throughout the day to help with nasal congestion and runny nose. For your cough, recommend using a humidifier in your bedroom at nighttime during sleep and sleeping elevated on pillows while cough symptoms persist. Go to the emergency department immediately if you experience a high fever greater than 102, worsening cough, shortness of breath, difficulty breathing, or other concerns. As discussed, continue to wear your mask while you are experiencing symptoms.  You will need to remain home if you develop a fever.  Do not return to normal  activities around others until you have been fever free for 24 hours with no medication. Follow-up as needed.      ED Prescriptions     Medication Sig Dispense Auth. Provider   promethazine-dextromethorphan (PROMETHAZINE-DM) 6.25-15 MG/5ML syrup Take 5 mLs by mouth 4 (four) times daily as needed. 118 mL Rebekah Mejia, Rebekah Haber, NP   fluticasone (FLONASE) 50 MCG/ACT nasal spray Place 2 sprays into both nostrils daily. 16 g Rebekah Mejia, Rebekah Haber, NP      PDMP not reviewed this encounter.   Rebekah Cantor, NP 10/08/22 1109    Rebekah Mejia, Rebekah Haber, NP 10/08/22 1131

## 2022-10-09 LAB — SARS CORONAVIRUS 2 (TAT 6-24 HRS): SARS Coronavirus 2: POSITIVE — AB

## 2022-10-11 ENCOUNTER — Ambulatory Visit
Admission: EM | Admit: 2022-10-11 | Discharge: 2022-10-11 | Disposition: A | Discharge status: Home / Self Care | Payer: BC Managed Care – PPO | Attending: Family Medicine | Admitting: Family Medicine

## 2022-10-11 ENCOUNTER — Other Ambulatory Visit: Payer: Self-pay

## 2022-10-11 ENCOUNTER — Encounter: Payer: Self-pay | Admitting: Emergency Medicine

## 2022-10-11 DIAGNOSIS — R197 Diarrhea, unspecified: Secondary | ICD-10-CM

## 2022-10-11 DIAGNOSIS — U071 COVID-19: Secondary | ICD-10-CM

## 2022-10-11 MED ORDER — LOPERAMIDE HCL 2 MG PO CAPS: 2.0000 mg | ORAL_CAPSULE | Freq: Four times a day (QID) | ORAL | 0 refills | Status: AC | PRN

## 2022-10-11 NOTE — ED Provider Notes (Signed)
[facility]    CSN: [id number] Arrival date & time: 10/11/22  1002      History   Chief Complaint Chief Complaint  Patient presents with   Diarrhea    HPI Rebekah Mejia is a 43 y.o. female.   Patient presenting today with ongoing cough, diarrhea, generalized malaise after being diagnosed with COVID over the weekend.  She is currently taking Paxlovid but states her symptoms have not resolved.  She is tolerating p.o. well but states this makes her have more diarrhea when she eats or drinks anything.  Denies fever, chest pain, shortness of breath, severe abdominal pain.  Requesting a work note extension.    History reviewed. No pertinent past medical history.  There are no problems to display for this patient.   Past Surgical History:  Procedure Laterality Date   HERNIA REPAIR      OB History     Gravida  0   Para  0   Term  0   Preterm  0   AB  0   Living  0      SAB  0   IAB  0   Ectopic  0   Multiple  0   Live Births  0            Home Medications    Prior to Admission medications   Medication Sig Start Date End Date Taking? Authorizing Provider  loperamide (IMODIUM) 2 MG capsule Take 1 capsule (2 mg total) by mouth 4 (four) times daily as needed for diarrhea or loose stools. 10/11/22  Yes Particia Nearing, PA-C  fluticasone [facility]) 50 MCG/ACT nasal spray Place 2 sprays into both nostrils daily. 10/08/22   Leath-Warren, Sadie Haber, NP  ibuprofen (ADVIL,MOTRIN) 600 MG tablet Take 1 tablet (600 mg total) by mouth every 6 (six) hours as needed. 05/06/16   Eber Hong, MD  nirmatrelvir & ritonavir (PAXLOVID, 300/100,) 20 x 150 MG & 10 x 100MG  TBPK Take 3 tablets by mouth 2 (two) times daily for 5 days. 10/08/22 10/13/22  Leath-Warren, Sadie Haber, NP  promethazine-dextromethorphan (PROMETHAZINE-DM) 6.25-15 MG/5ML syrup Take 5 mLs by mouth 4 (four) times daily as needed. 10/08/22   Leath-Warren, Sadie Haber, NP    Family  History History reviewed. No pertinent family history.  Social History Social History   Tobacco Use   Smoking status: Never   Smokeless tobacco: Never  Vaping Use   Vaping status: Never Used  Substance Use Topics   Alcohol use: No   Drug use: No     Allergies   Patient has no known allergies.   Review of Systems Review of Systems Per HPI  Physical Exam Triage Vital Signs ED Triage Vitals  Encounter Vitals Group     BP 10/11/22 1117 (!) 146/89     Systolic BP Percentile --      Diastolic BP Percentile --      Pulse Rate 10/11/22 1117 66     Resp 10/11/22 1117 20     Temp 10/11/22 1117 98.6 F (37 C)     Temp Source 10/11/22 1117 Oral     SpO2 10/11/22 1117 98 %     Weight --      Height --      Head Circumference --      Peak Flow --      Pain Score 10/11/22 1116 0     Pain Loc --      Pain Education --  Exclude from Growth Chart --    No data found.  Updated Vital Signs BP (!) 146/89 (BP Location: Right Arm)   Pulse 66   Temp 98.6 F (37 C) (Oral)   Resp 20   LMP 09/28/2022 (Approximate)   SpO2 98%   Visual Acuity Right Eye Distance:   Left Eye Distance:   Bilateral Distance:    Right Eye Near:   Left Eye Near:    Bilateral Near:     Physical Exam Vitals and nursing note reviewed.  Constitutional:      Appearance: Normal appearance.  HENT:     Head: Atraumatic.     Right Ear: Tympanic membrane and external ear normal.     Left Ear: Tympanic membrane and external ear normal.     Nose: Rhinorrhea present.     Mouth/Throat:     Mouth: Mucous membranes are moist.     Pharynx: No posterior oropharyngeal erythema.  Eyes:     Extraocular Movements: Extraocular movements intact.     Conjunctiva/sclera: Conjunctivae normal.  Cardiovascular:     Rate and Rhythm: Normal rate and regular rhythm.     Heart sounds: Normal heart sounds.  Pulmonary:     Effort: Pulmonary effort is normal.     Breath sounds: Normal breath sounds. No wheezing  or rales.  Abdominal:     General: Bowel sounds are normal. There is no distension.     Palpations: Abdomen is soft.     Tenderness: There is no abdominal tenderness. There is no guarding.  Musculoskeletal:        General: Normal range of motion.     Cervical back: Normal range of motion and neck supple.  Skin:    General: Skin is warm and dry.  Neurological:     Mental Status: She is alert and oriented to person, place, and time.  Psychiatric:        Mood and Affect: Mood normal.        Thought Content: Thought content normal.      UC Treatments / Results  Labs (all labs ordered are listed, but only abnormal results are displayed) Labs Reviewed - No data to display  EKG   Radiology No results found.  Procedures Procedures (including critical care time)  Medications Ordered in UC Medications - No data to display  Initial Impression / Assessment and Plan / UC Course  I have reviewed the triage vital signs and the nursing notes.  Pertinent labs & imaging results that were available during my care of the patient were reviewed by me and considered in my medical decision making (see chart for details).     Vitals and exam reassuring, still consistent with resolving COVID-19 infection.  Treat with Imodium, complete Paxlovid, discussed supportive over-the-counter medications and home care additionally.  Work note extended.  Return for worsening symptoms.  Final Clinical Impressions(s) / UC Diagnoses   Final diagnoses:  COVID-19  Diarrhea, unspecified type   Discharge Instructions   None    ED Prescriptions     Medication Sig Dispense Auth. Provider   loperamide (IMODIUM) 2 MG capsule Take 1 capsule (2 mg total) by mouth 4 (four) times daily as needed for diarrhea or loose stools. 12 capsule Particia Nearing, [place]      PDMP not reviewed this encounter.   Particia Nearing, [place] 10/11/22 1234

## 2022-10-11 NOTE — ED Triage Notes (Signed)
Pt reports was seen for similar this weekend and reports tested positive for covid. Pt has been taking paxlovid. States diarrhea continues and is unable to return to work. Initial work note was provided until today but reports "I can't go to work like this."

## 2023-08-28 ENCOUNTER — Other Ambulatory Visit (HOSPITAL_COMMUNITY): Payer: Self-pay | Admitting: Internal Medicine

## 2023-08-28 DIAGNOSIS — Z1231 Encounter for screening mammogram for malignant neoplasm of breast: Secondary | ICD-10-CM

## 2023-09-11 ENCOUNTER — Ambulatory Visit (HOSPITAL_COMMUNITY)
Admission: RE | Admit: 2023-09-11 | Discharge: 2023-09-11 | Disposition: A | Discharge status: Home / Self Care | Source: Ambulatory Visit | Attending: Internal Medicine | Admitting: Internal Medicine

## 2023-09-11 DIAGNOSIS — Z1231 Encounter for screening mammogram for malignant neoplasm of breast: Secondary | ICD-10-CM | POA: Diagnosis present

## 2023-09-14 ENCOUNTER — Encounter (HOSPITAL_COMMUNITY): Payer: Self-pay | Admitting: Internal Medicine

## 2023-09-18 ENCOUNTER — Other Ambulatory Visit (HOSPITAL_COMMUNITY): Payer: Self-pay | Admitting: Internal Medicine

## 2023-09-18 DIAGNOSIS — R928 Other abnormal and inconclusive findings on diagnostic imaging of breast: Secondary | ICD-10-CM

## 2023-10-03 ENCOUNTER — Ambulatory Visit (HOSPITAL_COMMUNITY)
Admission: RE | Admit: 2023-10-03 | Discharge: 2023-10-03 | Disposition: A | Discharge status: Home / Self Care | Source: Ambulatory Visit | Attending: Internal Medicine | Admitting: Internal Medicine

## 2023-10-03 DIAGNOSIS — R928 Other abnormal and inconclusive findings on diagnostic imaging of breast: Secondary | ICD-10-CM | POA: Insufficient documentation
